# Patient Record
Sex: Female | Born: 1960 | Race: White | Hispanic: No | Marital: Married | Smoking: Never smoker
Health system: Southern US, Community
[De-identification: ages and names within clinical notes are randomized; demographics above are authoritative.]

## PROBLEM LIST (undated history)

## (undated) DIAGNOSIS — Z9889 Other specified postprocedural states: Secondary | ICD-10-CM

## (undated) DIAGNOSIS — C801 Malignant (primary) neoplasm, unspecified: Secondary | ICD-10-CM

## (undated) DIAGNOSIS — C50919 Malignant neoplasm of unspecified site of unspecified female breast: Secondary | ICD-10-CM

## (undated) DIAGNOSIS — M79606 Pain in leg, unspecified: Secondary | ICD-10-CM

## (undated) DIAGNOSIS — R112 Nausea with vomiting, unspecified: Secondary | ICD-10-CM

## (undated) DIAGNOSIS — G971 Other reaction to spinal and lumbar puncture: Secondary | ICD-10-CM

## (undated) DIAGNOSIS — Z923 Personal history of irradiation: Secondary | ICD-10-CM

## (undated) HISTORY — PX: WISDOM TOOTH EXTRACTION: SHX21

---

## 2016-11-26 DIAGNOSIS — M79606 Pain in leg, unspecified: Secondary | ICD-10-CM

## 2016-11-26 HISTORY — DX: Pain in leg, unspecified: M79.606

## 2017-05-27 DIAGNOSIS — C801 Malignant (primary) neoplasm, unspecified: Secondary | ICD-10-CM

## 2017-05-27 HISTORY — DX: Malignant (primary) neoplasm, unspecified: C80.1

## 2017-06-10 ENCOUNTER — Other Ambulatory Visit: Payer: Self-pay | Admitting: General Surgery

## 2017-06-10 DIAGNOSIS — Z170 Estrogen receptor positive status [ER+]: Principal | ICD-10-CM

## 2017-06-10 DIAGNOSIS — C50511 Malignant neoplasm of lower-outer quadrant of right female breast: Secondary | ICD-10-CM

## 2017-06-11 ENCOUNTER — Encounter (HOSPITAL_BASED_OUTPATIENT_CLINIC_OR_DEPARTMENT_OTHER): Payer: Self-pay | Admitting: *Deleted

## 2017-06-11 ENCOUNTER — Other Ambulatory Visit: Payer: Self-pay | Admitting: General Surgery

## 2017-06-11 ENCOUNTER — Other Ambulatory Visit: Payer: Self-pay

## 2017-06-11 DIAGNOSIS — C50511 Malignant neoplasm of lower-outer quadrant of right female breast: Secondary | ICD-10-CM

## 2017-06-11 DIAGNOSIS — Z170 Estrogen receptor positive status [ER+]: Principal | ICD-10-CM

## 2017-06-25 ENCOUNTER — Ambulatory Visit
Admission: RE | Admit: 2017-06-25 | Discharge: 2017-06-25 | Disposition: A | Discharge status: Home / Self Care | Payer: Managed Care, Other (non HMO) | Source: Ambulatory Visit | Attending: General Surgery | Admitting: General Surgery

## 2017-06-25 DIAGNOSIS — C50511 Malignant neoplasm of lower-outer quadrant of right female breast: Secondary | ICD-10-CM

## 2017-06-25 DIAGNOSIS — Z170 Estrogen receptor positive status [ER+]: Principal | ICD-10-CM

## 2017-06-25 NOTE — Pre-Procedure Instructions (Signed)
NPO after midnight tonight except ensure presurg to complete by 0615. Pt verbalized understanding.

## 2017-06-26 ENCOUNTER — Other Ambulatory Visit: Payer: Self-pay

## 2017-06-26 ENCOUNTER — Ambulatory Visit
Admission: RE | Admit: 2017-06-26 | Discharge: 2017-06-26 | Disposition: A | Discharge status: Home / Self Care | Payer: Managed Care, Other (non HMO) | Source: Ambulatory Visit | Attending: General Surgery | Admitting: General Surgery

## 2017-06-26 ENCOUNTER — Ambulatory Visit (HOSPITAL_BASED_OUTPATIENT_CLINIC_OR_DEPARTMENT_OTHER)
Admission: RE | Admit: 2017-06-26 | Discharge: 2017-06-26 | Disposition: A | Discharge status: Home / Self Care | Payer: Managed Care, Other (non HMO) | Source: Ambulatory Visit | Attending: General Surgery | Admitting: General Surgery

## 2017-06-26 ENCOUNTER — Ambulatory Visit (HOSPITAL_BASED_OUTPATIENT_CLINIC_OR_DEPARTMENT_OTHER): Payer: Managed Care, Other (non HMO) | Admitting: Certified Registered"

## 2017-06-26 ENCOUNTER — Encounter (HOSPITAL_BASED_OUTPATIENT_CLINIC_OR_DEPARTMENT_OTHER)
Admission: RE | Disposition: A | Discharge status: Home / Self Care | Payer: Self-pay | Source: Ambulatory Visit | Attending: General Surgery

## 2017-06-26 ENCOUNTER — Encounter (HOSPITAL_COMMUNITY): Payer: Managed Care, Other (non HMO)

## 2017-06-26 ENCOUNTER — Encounter (HOSPITAL_BASED_OUTPATIENT_CLINIC_OR_DEPARTMENT_OTHER): Payer: Self-pay | Admitting: Anesthesiology

## 2017-06-26 ENCOUNTER — Encounter (HOSPITAL_COMMUNITY)
Admission: RE | Admit: 2017-06-26 | Discharge: 2017-06-26 | Disposition: A | Discharge status: Home / Self Care | Payer: Managed Care, Other (non HMO) | Source: Ambulatory Visit | Attending: General Surgery | Admitting: General Surgery

## 2017-06-26 DIAGNOSIS — Z170 Estrogen receptor positive status [ER+]: Principal | ICD-10-CM

## 2017-06-26 DIAGNOSIS — C50511 Malignant neoplasm of lower-outer quadrant of right female breast: Secondary | ICD-10-CM

## 2017-06-26 HISTORY — PX: BREAST LUMPECTOMY: SHX2

## 2017-06-26 HISTORY — DX: Pain in leg, unspecified: M79.606

## 2017-06-26 HISTORY — DX: Other reaction to spinal and lumbar puncture: G97.1

## 2017-06-26 HISTORY — DX: Other specified postprocedural states: Z98.890

## 2017-06-26 HISTORY — DX: Other specified postprocedural states: R11.2

## 2017-06-26 HISTORY — PX: BREAST LUMPECTOMY WITH RADIOACTIVE SEED AND SENTINEL LYMPH NODE BIOPSY: SHX6550

## 2017-06-26 HISTORY — DX: Malignant (primary) neoplasm, unspecified: C80.1

## 2017-06-26 SURGERY — BREAST LUMPECTOMY WITH RADIOACTIVE SEED AND SENTINEL LYMPH NODE BIOPSY
Anesthesia: General | Site: Breast | Laterality: Right

## 2017-06-26 MED ORDER — ACETAMINOPHEN 500 MG PO TABS
ORAL_TABLET | ORAL | Status: AC
  Filled 2017-06-26: qty 2

## 2017-06-26 MED ORDER — CEFAZOLIN SODIUM-DEXTROSE 2-4 GM/100ML-% IV SOLN
2.0000 g | INTRAVENOUS | Status: AC
  Administered 2017-06-26: 2 g via INTRAVENOUS

## 2017-06-26 MED ORDER — FENTANYL CITRATE (PF) 100 MCG/2ML IJ SOLN
INTRAMUSCULAR | Status: AC
  Filled 2017-06-26: qty 2

## 2017-06-26 MED ORDER — ENSURE PRE-SURGERY PO LIQD: 296.0000 mL | Freq: Once | ORAL | Status: DC

## 2017-06-26 MED ORDER — BUPIVACAINE-EPINEPHRINE (PF) 0.5% -1:200000 IJ SOLN
INTRAMUSCULAR | Status: DC | PRN
  Administered 2017-06-26: 30 mL via PERINEURAL

## 2017-06-26 MED ORDER — LACTATED RINGERS IV SOLN
INTRAVENOUS | Status: DC
  Administered 2017-06-26 (×2): via INTRAVENOUS

## 2017-06-26 MED ORDER — SCOPOLAMINE 1 MG/3DAYS TD PT72
1.0000 | MEDICATED_PATCH | Freq: Once | TRANSDERMAL | Status: DC | PRN
  Administered 2017-06-26: 1.5 mg via TRANSDERMAL

## 2017-06-26 MED ORDER — FENTANYL CITRATE (PF) 100 MCG/2ML IJ SOLN
25.0000 ug | INTRAMUSCULAR | Status: DC | PRN
Start: 2017-06-26 — End: 2017-06-26

## 2017-06-26 MED ORDER — BUPIVACAINE-EPINEPHRINE 0.25% -1:200000 IJ SOLN
INTRAMUSCULAR | Status: DC | PRN
  Administered 2017-06-26: 10 mL

## 2017-06-26 MED ORDER — 0.9 % SODIUM CHLORIDE (POUR BTL) OPTIME
TOPICAL | Status: DC | PRN
  Administered 2017-06-26: 1000 mL

## 2017-06-26 MED ORDER — FENTANYL CITRATE (PF) 100 MCG/2ML IJ SOLN
50.0000 ug | INTRAMUSCULAR | Status: DC | PRN
  Administered 2017-06-26: 100 ug via INTRAVENOUS

## 2017-06-26 MED ORDER — PROPOFOL 10 MG/ML IV BOLUS
INTRAVENOUS | Status: DC | PRN
  Administered 2017-06-26: 150 mg via INTRAVENOUS
  Administered 2017-06-26: 50 mg via INTRAVENOUS

## 2017-06-26 MED ORDER — FENTANYL CITRATE (PF) 100 MCG/2ML IJ SOLN
INTRAMUSCULAR | Status: DC | PRN
  Administered 2017-06-26: 25 ug via INTRAVENOUS
  Administered 2017-06-26: 50 ug via INTRAVENOUS
  Administered 2017-06-26: 25 ug via INTRAVENOUS
  Administered 2017-06-26: 50 ug via INTRAVENOUS
  Administered 2017-06-26: 25 ug via INTRAVENOUS

## 2017-06-26 MED ORDER — DEXAMETHASONE SODIUM PHOSPHATE 4 MG/ML IJ SOLN: INTRAMUSCULAR | Status: DC | PRN

## 2017-06-26 MED ORDER — MIDAZOLAM HCL 2 MG/2ML IJ SOLN
INTRAMUSCULAR | Status: AC
  Filled 2017-06-26: qty 2

## 2017-06-26 MED ORDER — KETOROLAC TROMETHAMINE 15 MG/ML IJ SOLN
15.0000 mg | INTRAMUSCULAR | Status: AC
  Administered 2017-06-26: 15 mg via INTRAVENOUS

## 2017-06-26 MED ORDER — PROMETHAZINE HCL 25 MG/ML IJ SOLN: 6.2500 mg | INTRAMUSCULAR | Status: DC | PRN

## 2017-06-26 MED ORDER — DEXAMETHASONE SODIUM PHOSPHATE 4 MG/ML IJ SOLN
INTRAMUSCULAR | Status: DC | PRN
  Administered 2017-06-26: 10 mg via INTRAVENOUS

## 2017-06-26 MED ORDER — CEFAZOLIN SODIUM-DEXTROSE 2-4 GM/100ML-% IV SOLN
INTRAVENOUS | Status: AC
  Filled 2017-06-26: qty 100

## 2017-06-26 MED ORDER — ONDANSETRON HCL 4 MG/2ML IJ SOLN
INTRAMUSCULAR | Status: AC
  Filled 2017-06-26: qty 2

## 2017-06-26 MED ORDER — DEXAMETHASONE SODIUM PHOSPHATE 10 MG/ML IJ SOLN
INTRAMUSCULAR | Status: AC
  Filled 2017-06-26: qty 1

## 2017-06-26 MED ORDER — TECHNETIUM TC 99M SULFUR COLLOID FILTERED
1.0000 | Freq: Once | INTRAVENOUS | Status: AC | PRN
  Administered 2017-06-26: 1 via INTRADERMAL

## 2017-06-26 MED ORDER — ACETAMINOPHEN 500 MG PO TABS
1000.0000 mg | ORAL_TABLET | ORAL | Status: AC
  Administered 2017-06-26: 1000 mg via ORAL

## 2017-06-26 MED ORDER — MIDAZOLAM HCL 5 MG/5ML IJ SOLN
INTRAMUSCULAR | Status: DC | PRN
  Administered 2017-06-26: 2 mg via INTRAVENOUS

## 2017-06-26 MED ORDER — MIDAZOLAM HCL 2 MG/2ML IJ SOLN
1.0000 mg | INTRAMUSCULAR | Status: DC | PRN
  Administered 2017-06-26: 2 mg via INTRAVENOUS

## 2017-06-26 MED ORDER — GABAPENTIN 100 MG PO CAPS
100.0000 mg | ORAL_CAPSULE | ORAL | Status: AC
  Administered 2017-06-26: 100 mg via ORAL

## 2017-06-26 MED ORDER — GLYCOPYRROLATE 0.2 MG/ML IJ SOLN
INTRAMUSCULAR | Status: DC | PRN
  Administered 2017-06-26: 0.1 mg via INTRAVENOUS

## 2017-06-26 MED ORDER — PHENYLEPHRINE 40 MCG/ML (10ML) SYRINGE FOR IV PUSH (FOR BLOOD PRESSURE SUPPORT)
PREFILLED_SYRINGE | INTRAVENOUS | Status: AC
  Filled 2017-06-26: qty 20

## 2017-06-26 MED ORDER — ONDANSETRON HCL 4 MG/2ML IJ SOLN
INTRAMUSCULAR | Status: DC | PRN
  Administered 2017-06-26: 4 mg via INTRAVENOUS

## 2017-06-26 MED ORDER — LIDOCAINE HCL (CARDIAC) PF 100 MG/5ML IV SOSY
PREFILLED_SYRINGE | INTRAVENOUS | Status: DC | PRN
  Administered 2017-06-26: 30 mg via INTRAVENOUS

## 2017-06-26 MED ORDER — CLONIDINE HCL (ANALGESIA) 100 MCG/ML EP SOLN
EPIDURAL | Status: DC | PRN
  Administered 2017-06-26: 50 ug

## 2017-06-26 MED ORDER — OXYCODONE-ACETAMINOPHEN 10-325 MG PO TABS: 1.0000 | ORAL_TABLET | Freq: Four times a day (QID) | ORAL | 0 refills | Status: DC | PRN

## 2017-06-26 MED ORDER — KETOROLAC TROMETHAMINE 15 MG/ML IJ SOLN
INTRAMUSCULAR | Status: AC
  Filled 2017-06-26: qty 1

## 2017-06-26 MED ORDER — GABAPENTIN 300 MG PO CAPS
ORAL_CAPSULE | ORAL | Status: AC
  Filled 2017-06-26: qty 1

## 2017-06-26 MED ORDER — LIDOCAINE HCL (CARDIAC) PF 100 MG/5ML IV SOSY
PREFILLED_SYRINGE | INTRAVENOUS | Status: AC
  Filled 2017-06-26: qty 5

## 2017-06-26 MED ORDER — SCOPOLAMINE 1 MG/3DAYS TD PT72
MEDICATED_PATCH | TRANSDERMAL | Status: AC
  Filled 2017-06-26: qty 1

## 2017-06-26 MED ORDER — PROPOFOL 10 MG/ML IV BOLUS
INTRAVENOUS | Status: AC
  Filled 2017-06-26: qty 20

## 2017-06-26 MED ORDER — PHENYLEPHRINE HCL 10 MG/ML IJ SOLN
INTRAMUSCULAR | Status: DC | PRN
  Administered 2017-06-26 (×6): 80 ug via INTRAVENOUS

## 2017-06-26 MED ORDER — GABAPENTIN 100 MG PO CAPS
ORAL_CAPSULE | ORAL | Status: AC
  Filled 2017-06-26: qty 1

## 2017-06-26 SURGICAL SUPPLY — 58 items
APPLIER CLIP 9.375 MED OPEN (MISCELLANEOUS) ×3
BINDER BREAST LRG (GAUZE/BANDAGES/DRESSINGS) IMPLANT
BINDER BREAST MEDIUM (GAUZE/BANDAGES/DRESSINGS) IMPLANT
BINDER BREAST XLRG (GAUZE/BANDAGES/DRESSINGS) ×3 IMPLANT
BINDER BREAST XXLRG (GAUZE/BANDAGES/DRESSINGS) IMPLANT
BLADE SURG 15 STRL LF DISP TIS (BLADE) ×1 IMPLANT
BLADE SURG 15 STRL SS (BLADE) ×2
CANISTER SUC SOCK COL 7IN (MISCELLANEOUS) IMPLANT
CANISTER SUCT 1200ML W/VALVE (MISCELLANEOUS) IMPLANT
CHLORAPREP W/TINT 26ML (MISCELLANEOUS) ×3 IMPLANT
CLIP APPLIE 9.375 MED OPEN (MISCELLANEOUS) ×1 IMPLANT
CLIP VESOCCLUDE SM WIDE 6/CT (CLIP) IMPLANT
CLOSURE WOUND 1/2 X4 (GAUZE/BANDAGES/DRESSINGS) ×2
COVER BACK TABLE 60X90IN (DRAPES) ×3 IMPLANT
COVER MAYO STAND STRL (DRAPES) ×3 IMPLANT
COVER PROBE W GEL 5X96 (DRAPES) ×3 IMPLANT
DECANTER SPIKE VIAL GLASS SM (MISCELLANEOUS) IMPLANT
DERMABOND ADVANCED (GAUZE/BANDAGES/DRESSINGS) ×4
DERMABOND ADVANCED .7 DNX12 (GAUZE/BANDAGES/DRESSINGS) ×2 IMPLANT
DEVICE DUBIN W/COMP PLATE 8390 (MISCELLANEOUS) ×3 IMPLANT
DRAPE LAPAROSCOPIC ABDOMINAL (DRAPES) ×3 IMPLANT
DRAPE UTILITY XL STRL (DRAPES) ×3 IMPLANT
ELECT COATED BLADE 2.86 ST (ELECTRODE) ×3 IMPLANT
ELECT REM PT RETURN 9FT ADLT (ELECTROSURGICAL) ×3
ELECTRODE REM PT RTRN 9FT ADLT (ELECTROSURGICAL) ×1 IMPLANT
GLOVE BIO SURGEON STRL SZ7 (GLOVE) ×6 IMPLANT
GLOVE BIOGEL PI IND STRL 7.5 (GLOVE) ×1 IMPLANT
GLOVE BIOGEL PI INDICATOR 7.5 (GLOVE) ×2
GOWN STRL REUS W/ TWL LRG LVL3 (GOWN DISPOSABLE) ×2 IMPLANT
GOWN STRL REUS W/TWL LRG LVL3 (GOWN DISPOSABLE) ×4
HEMOSTAT ARISTA ABSORB 3G PWDR (MISCELLANEOUS) IMPLANT
ILLUMINATOR WAVEGUIDE N/F (MISCELLANEOUS) IMPLANT
KIT MARKER MARGIN INK (KITS) ×3 IMPLANT
LIGHT WAVEGUIDE WIDE FLAT (MISCELLANEOUS) IMPLANT
NDL SAFETY ECLIPSE 18X1.5 (NEEDLE) IMPLANT
NEEDLE HYPO 18GX1.5 SHARP (NEEDLE)
NEEDLE HYPO 25X1 1.5 SAFETY (NEEDLE) ×3 IMPLANT
NS IRRIG 1000ML POUR BTL (IV SOLUTION) ×3 IMPLANT
PACK BASIN DAY SURGERY FS (CUSTOM PROCEDURE TRAY) ×3 IMPLANT
PENCIL BUTTON HOLSTER BLD 10FT (ELECTRODE) ×3 IMPLANT
SLEEVE SCD COMPRESS KNEE MED (MISCELLANEOUS) ×3 IMPLANT
SPONGE LAP 4X18 X RAY DECT (DISPOSABLE) ×3 IMPLANT
STRIP CLOSURE SKIN 1/2X4 (GAUZE/BANDAGES/DRESSINGS) ×4 IMPLANT
SUT ETHILON 2 0 FS 18 (SUTURE) IMPLANT
SUT MNCRL AB 4-0 PS2 18 (SUTURE) ×3 IMPLANT
SUT MON AB 5-0 PS2 18 (SUTURE) ×3 IMPLANT
SUT SILK 2 0 SH (SUTURE) ×3 IMPLANT
SUT VIC AB 2-0 SH 27 (SUTURE) ×4
SUT VIC AB 2-0 SH 27XBRD (SUTURE) ×2 IMPLANT
SUT VIC AB 3-0 SH 27 (SUTURE) ×4
SUT VIC AB 3-0 SH 27X BRD (SUTURE) ×2 IMPLANT
SUT VIC AB 5-0 PS2 18 (SUTURE) IMPLANT
SYR CONTROL 10ML LL (SYRINGE) ×3 IMPLANT
TOWEL OR 17X24 6PK STRL BLUE (TOWEL DISPOSABLE) ×3 IMPLANT
TOWEL OR NON WOVEN STRL DISP B (DISPOSABLE) ×3 IMPLANT
TUBE CONNECTING 20'X1/4 (TUBING)
TUBE CONNECTING 20X1/4 (TUBING) IMPLANT
YANKAUER SUCT BULB TIP NO VENT (SUCTIONS) IMPLANT

## 2017-06-26 NOTE — Discharge Instructions (Signed)
[facility] °Office Phone Number [phone] ° °POST OP INSTRUCTIONS ° °Always review your discharge instruction sheet given to you by the facility where your surgery was performed. ° °IF YOU HAVE DISABILITY OR FAMILY LEAVE FORMS, YOU MUST BRING THEM TO THE OFFICE FOR PROCESSING.  DO NOT GIVE THEM TO YOUR DOCTOR. ° °1. A prescription for pain medication may be given to you upon discharge.  Take your pain medication as prescribed, if needed.  If narcotic pain medicine is not needed, then you may take acetaminophen (Tylenol), naprosyn (Alleve) or ibuprofen (Advil) as needed. °2. Take your usually prescribed medications unless otherwise directed °3. If you need a refill on your pain medication, please contact your pharmacy.  They will contact our office to request authorization.  Prescriptions will not be filled after 5pm or on week-ends. °4. You should eat very light the first 24 hours after surgery, such as soup, crackers, pudding, etc.  Resume your normal diet the day after surgery. °5. Most patients will experience some swelling and bruising in the breast.  Ice packs and a good support bra will help.  Wear the breast binder provided or a sports bra for 72 hours day and night.  After that wear a sports bra during the day until you return to the office. Swelling and bruising can take several days to resolve.  °6. It is common to experience some constipation if taking pain medication after surgery.  Increasing fluid intake and taking a stool softener will usually help or prevent this problem from occurring.  A mild laxative (Milk of Magnesia or Miralax) should be taken according to package directions if there are no bowel movements after 48 hours. °7. Unless discharge instructions indicate otherwise, you may remove your bandages 48 hours after surgery and you may shower at that time.  You may have steri-strips (small skin tapes) in place directly over the incision.  These strips should be left on the  skin for 7-10 days and will come off on their own.  If your surgeon used skin glue on the incision, you may shower in 24 hours.  The glue will flake off over the next 2-3 weeks.  Any sutures or staples will be removed at the office during your follow-up visit. °8. ACTIVITIES:  You may resume regular daily activities (gradually increasing) beginning the next day.  Wearing a good support bra or sports bra minimizes pain and swelling.  You may have sexual intercourse when it is comfortable. °a. You may drive when you no longer are taking prescription pain medication, you can comfortably wear a seatbelt, and you can safely maneuver your car and apply brakes. °b. RETURN TO WORK:  ______________________________________________________________________________________ °9. You should see your doctor in the office for a follow-up appointment approximately two weeks after your surgery.  Your doctor’s nurse will typically make your follow-up appointment when she calls you with your pathology report.  Expect your pathology report 3-4 business days after your surgery.  You may call to check if you do not hear from us after three days. °10. OTHER INSTRUCTIONS: _______________________________________________________________________________________________ _____________________________________________________________________________________________________________________________________ °_____________________________________________________________________________________________________________________________________ °_____________________________________________________________________________________________________________________________________ ° °WHEN TO CALL DR WAKEFIELD: °1. Fever over 101.0 °2. Nausea and/or vomiting. °3. Extreme swelling or bruising. °4. Continued bleeding from incision. °5. Increased pain, redness, or drainage from the incision. ° °The clinic staff is available to answer your questions during regular  business hours.  Please don’t hesitate to call and ask to speak to one of the nurses for clinical concerns.  If   you have a medical emergency, go to the nearest emergency room or call 911.  A surgeon from [facility] is always on call at the hospital.  For further questions, please visit [email or web address]    Regional Anesthesia Blocks  1. Numbness or the inability to move the "blocked" extremity may last from 3-48 hours after placement. The length of time depends on the medication injected and your individual response to the medication. If the numbness is not going away after 48 hours, call your surgeon.  2. The extremity that is blocked will need to be protected until the numbness is gone and the  Strength has returned. Because you cannot feel it, you will need to take extra care to avoid injury. Because it may be weak, you may have difficulty moving it or using it. You may not know what position it is in without looking at it while the block is in effect.  3. For blocks in the legs and feet, returning to weight bearing and walking needs to be done carefully. You will need to wait until the numbness is entirely gone and the strength has returned. You should be able to move your leg and foot normally before you try and bear weight or walk. You will need someone to be with you when you first try to ensure you do not fall and possibly risk injury.  4. Bruising and tenderness at the needle site are common side effects and will resolve in a few days.  5. Persistent numbness or new problems with movement should be communicated to the surgeon or the [facility] [phone] [place] [phone]).

## 2017-06-26 NOTE — Transfer of Care (Signed)
Immediate Anesthesia Transfer of Care Note  Patient: Carla Bryan  Procedure(s) Performed: BREAST LUMPECTOMY WITH RADIOACTIVE SEED AND SENTINEL LYMPH NODE BIOPSY (Right Breast)  Patient Location: PACU  Anesthesia Type:GA combined with regional for post-op pain  Level of Consciousness: awake and patient cooperative  Airway & Oxygen Therapy: Patient Spontanous Breathing and Patient connected to face mask oxygen  Post-op Assessment: Report given to RN and Post -op Vital signs reviewed and stable  Post vital signs: Reviewed and stable  Last Vitals:  Vitals Value Taken Time  BP    Temp    Pulse 90 06/26/2017 11:06 AM  Resp    SpO2 98 % 06/26/2017 11:06 AM  Vitals shown include unvalidated device data.  Last Pain:  Vitals:   06/26/17 0856  TempSrc: Oral         Complications: No apparent anesthesia complications

## 2017-06-26 NOTE — Interval H&P Note (Signed)
History and Physical Interval Note:  06/26/2017 9:46 AM  Carla Bryan  has presented today for surgery, with the diagnosis of RIGHT BREAST CANCER  The various methods of treatment have been discussed with the patient and family. After consideration of risks, benefits and other options for treatment, the patient has consented to  Procedure(s): BREAST LUMPECTOMY WITH RADIOACTIVE SEED AND SENTINEL LYMPH NODE BIOPSY (Right) as a surgical intervention .  The patient's history has been reviewed, patient examined, no change in status, stable for surgery.  I have reviewed the patient's chart and labs.  Questions were answered to the patient's satisfaction.     Rolm Bookbinder

## 2017-06-26 NOTE — H&P (Signed)
57 yof referred by Aldean Ast for new right breast cancer. no personal history of breast disease. no fh breast or ovarian cancer. she had a ct scan due to swelling in her right leg and apparently had right breast mass noted. swelling still there has US showing no dvt per her report. she then had mm and [place]. has b density breasts. had 11 mm slightly lower outer quadrant right breast massss. on [place] this is 10x8x8 mm in size. axilla is negative by [place]. 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 she has no mass or dc to report. she is here to discuss options with her husband and her daughter. she lives in [place].   Past Surgical History Sabino Gasser; 06/10/2017 3:36 PM) Cesarean Section - Multiple   Diagnostic Studies History Sabino Gasser; 06/10/2017 3:36 PM) Colonoscopy  never Pap Smear  1-5 years ago  Allergies Sabino Gasser; 06/10/2017 3:36 PM) Azithromycin *CHEMICALS*  Allergies Reconciled   Medication History Sabino Gasser; 06/10/2017 3:36 PM) Gabapentin (100MG  Capsule, Oral) Active. Medications Reconciled  Social History Sabino Gasser; 06/10/2017 3:36 PM) Caffeine use  Tea. No alcohol use  No drug use  Tobacco use  Never smoker.  Family History Sabino Gasser; 06/10/2017 3:36 PM) First Degree Relatives  No pertinent family history   Pregnancy / Birth History Sabino Gasser; 06/10/2017 3:36 PM) Age at menarche  57 years. Age of menopause  47-50 Gravida  2 Length (months) of breastfeeding  12-24 Maternal age  56-30 Para  2  Other Problems Sabino Gasser; 06/10/2017 3:36 PM) No pertinent past medical history     Review of Systems Sabino Gasser; 06/10/2017 3:36 PM) General Not Present- Appetite Loss, Chills, Fatigue, Fever, Night Sweats, Weight Gain and Weight Loss. Skin Not Present- Change in Wart/Mole, Dryness, Hives, Jaundice, New Lesions, Non-Healing Wounds, Rash and  Ulcer. HEENT Not Present- Earache, Hearing Loss, Hoarseness, Nose Bleed, Oral Ulcers, Ringing in the Ears, Seasonal Allergies, Sinus Pain, Sore Throat, Visual Disturbances, Wears glasses/contact lenses and Yellow Eyes. Respiratory Not Present- Bloody sputum, Chronic Cough, Difficulty Breathing, Snoring and Wheezing. Breast Not Present- Breast Mass, Breast Pain, Nipple Discharge and Skin Changes. Cardiovascular Not Present- Chest Pain, Difficulty Breathing Lying Down, Leg Cramps, Palpitations, Rapid Heart Rate, Shortness of Breath and Swelling of Extremities. Gastrointestinal Not Present- Abdominal Pain, Bloating, Bloody Stool, Change in Bowel Habits, Chronic diarrhea, Constipation, Difficulty Swallowing, Excessive gas, Gets full quickly at meals, Hemorrhoids, Indigestion, Nausea, Rectal Pain and Vomiting. Female Genitourinary Not Present- Frequency, Nocturia, Painful Urination, Pelvic Pain and Urgency. Musculoskeletal Present- Joint Pain and Swelling of Extremities. Not Present- Back Pain, Joint Stiffness, Muscle Pain and Muscle Weakness. Neurological Not Present- Decreased Memory, Fainting, Headaches, Numbness, Seizures, Tingling, Tremor, Trouble walking and Weakness. Psychiatric Not Present- Anxiety, Bipolar, Change in Sleep Pattern, Depression, Fearful and Frequent crying. Endocrine Not Present- Cold Intolerance, Excessive Hunger, Hair Changes, Heat Intolerance, Hot flashes and New Diabetes. Hematology Not Present- Blood Thinners, Easy Bruising, Excessive bleeding, Gland problems, HIV and Persistent Infections.  Vitals Sabino Gasser; 06/10/2017 3:37 PM) 06/10/2017 3:36 PM Weight: 181.5 lb Height: 59in Body Surface Area: 1.77 m Body Mass Index: 36.66 kg/m  Temp.: 98.31F(Oral)  Pulse: 107 (Regular)  BP: 130/82 (Sitting, Left Arm, Standard) Physical Exam Rolm Bookbinder MD; 06/10/2017 10:53 PM) General Mental Status-Alert. Head and Neck Trachea-midline. Thyroid Gland  Characteristics - normal size and consistency. Eye Sclera/Conjunctiva - Bilateral-No scleral icterus. Chest and Lung Exam  Chest and lung exam reveals -quiet, even and easy respiratory effort with no use of accessory muscles and on auscultation, normal breath sounds, no adventitious sounds and normal vocal resonance. Breast Nipples-No Discharge. Breast Lump-No Palpable Breast Mass. Cardiovascular Cardiovascular examination reveals -normal heart sounds, regular rate and rhythm with no murmurs. Abdomen Note: soft no hepatomegaly Neurologic Neurologic evaluation reveals -alert and oriented x 3 with no impairment of recent or remote memory. Lymphatic Head & Neck General Head & Neck Lymphatics: Bilateral - Description - Normal. Axillary General Axillary Region: Bilateral - Description - Normal. Note: no [place] adenopathy   Assessment & Plan Rolm Bookbinder MD; 06/10/2017 11:00 PM) BREAST CANCER OF LOWER-OUTER QUADRANT OF RIGHT FEMALE BREAST (C50.511) Story: Right breast seed guided lumpectomy, right axillary sentinel node biopsy I discussed staging and pathophysiology of breast cancer with all available treatment options. We discussed that I dont think she needs either genetics or mri testing. I recommend sentinel node biopsy. we discussed performance with radioactive tracer and incision below axillary hairline. will await permanent pathology prior to any decisions. small chance to return to or for alnd. understands the 5-8% risk of lymphedema and shoulder issues as well. We discussed lumpectomy with radiotherapy vs mastectomy. I think she is good lumpectomy candidate. we discussed mastectomy is not 100% preventive and really provides no local control or survival advantage for her. lumpectomy woudl be combined with radiotherapy and there is 5% risk of positive margin requiring additional surgery. discussed risks of surgery and recovery with her. discussed oncotype to determine  chemotherapy after surgery. will refer to med onc in [place] and investigate rad onc closer to her home. will schedule surgery in next couple weeks.

## 2017-06-26 NOTE — Anesthesia Procedure Notes (Signed)
Anesthesia Regional Block: Pectoralis block   Pre-Anesthetic Checklist: ,, timeout performed, Correct Patient, Correct Site, Correct Laterality, Correct Procedure, Correct Position, site marked, Risks and benefits discussed,  Surgical consent,  Pre-op evaluation,  At surgeon's request and post-op pain management  Laterality: Right  Prep: chloraprep       Needles:  Injection technique: Single-shot  Needle Type: Echogenic Needle     Needle Length: 9cm  Needle Gauge: 21     Additional Needles:   Procedures:,,,, ultrasound used (permanent image in chart),,,,  Narrative:  Start time: 06/26/2017 9:32 AM End time: 06/26/2017 9:37 AM Injection made incrementally with aspirations every 5 mL.  Performed by: Personally  Anesthesiologist: Catalina Gravel, MD  Additional Notes: No pain on injection. No increased resistance to injection. Injection made in 5cc increments.  Good needle visualization.  Patient tolerated procedure well.

## 2017-06-26 NOTE — Progress Notes (Signed)
Assisted Dr. Gifford Shave with right, ultrasound guided, pectoralis block. Side rails up, monitors on throughout procedure. See vital signs in flow sheet. Tolerated Procedure well.

## 2017-06-26 NOTE — Op Note (Signed)
Preoperative diagnosis: clinical stage I right breast cancer Postoperative diagnosis: same as above Procedure: Right breast seed guided lumpectomy Rightdeep axillary sentinel node biopsy Surgeon: Dr Serita Grammes DDU:[id number] Anes: general  Specimens  1.rightbreast tissue marked with paint 2.rightaxillary sentinel nodes with highest [id number] Complications none Drains none Sponge count correct Dispo to pacu stable  Indications: This is a33 yof with clinical stage I right breast cancer. We have discussed options and have elected to proceed with lumpectomy/sn biopsy.  A seed was placed in the breast mass prior to beginning.    Procedure: After informed consent was obtained the patient was taken to the operating room. She first was given technetium in standard periareolar fashion. She had a pectoral block. She was given antibiotics. Sequential compression devices were on her legs. She was then placed under general anesthesia with an LMA. Then she was prepped and draped in the standard sterile surgical fashion. Surgical timeout was then performed.  I then located the seed in thelower outer right breastI infiltrated marcaine and then made a periareolar incision to hide the scar. I then used the neoprobe to remove the seed and the surrounding tissue with attempt to get clear margins. I marked this with paint. MM confirmed removal of seed and theclip.I did remove additional posterior and inferior margins as the seed was close to that. the posterior margin is muscle. I placed clips in the cavity.I then obtained hemostasis. This was marked as above.I closed with with 2-0 vicryl to approximate breast tissue. The skin was closed with 3-0 vicryl and 5-0 monocryl. Glue and steristrips were placed.   I then made a low axillary incision after locating the sentinel node. Icarried this through the axillary fascia.there was radioactivity present that was easily noted I removed  the radioactive nodes.The background radioactivity was minimal.I then obtained hemostasis. I closed the axillary fascia with 2-0 vicryl.The skin was closed with 3-0 vicryl and 4-0 monocryl. Glue and steristrips were applied

## 2017-06-26 NOTE — Anesthesia Preprocedure Evaluation (Signed)
Anesthesia Evaluation  Patient identified by MRN, date of birth, ID band Patient awake    Reviewed: Allergy & Precautions, NPO status , Patient's Chart, lab work & pertinent test results  History of Anesthesia Complications (+) PONV, POST - OP SPINAL HEADACHE and history of anesthetic complications  Airway Mallampati: II  TM Distance: >3 FB Neck ROM: Full    Dental  (+) Teeth Intact, Dental Advisory Given   Pulmonary neg pulmonary ROS,    Pulmonary exam normal breath sounds clear to auscultation       Cardiovascular Exercise Tolerance: Good negative cardio ROS Normal cardiovascular exam Rhythm:Regular Rate:Normal     Neuro/Psych  Headaches,    GI/Hepatic negative GI ROS, Neg liver ROS,   Endo/Other  negative endocrine ROSObesity   Renal/GU negative Renal ROS     Musculoskeletal negative musculoskeletal ROS (+)   Abdominal   Peds  Hematology negative hematology ROS (+)   Anesthesia Other Findings Day of surgery medications reviewed with the patient.  Right breast cancer  Reproductive/Obstetrics                             Anesthesia Physical Anesthesia Plan  ASA: II  Anesthesia Plan: General   Post-op Pain Management:  Regional for Post-op pain   Induction: Intravenous  PONV Risk Score and Plan: 4 or greater and Scopolamine patch - Pre-op, Midazolam, Dexamethasone and Ondansetron  Airway Management Planned: LMA  Additional Equipment:   Intra-op Plan:   Post-operative Plan: Extubation in OR  Informed Consent: I have reviewed the patients History and Physical, chart, labs and discussed the procedure including the risks, benefits and alternatives for the proposed anesthesia with the patient or authorized representative who has indicated his/her understanding and acceptance.   Dental advisory given  Plan Discussed with: CRNA  Anesthesia Plan Comments:          Anesthesia Quick Evaluation

## 2017-06-26 NOTE — Anesthesia Procedure Notes (Signed)
Procedure Name: LMA Insertion Date/Time: 06/26/2017 10:08 AM Performed by: Marrianne Mood, CRNA Pre-anesthesia Checklist: Patient identified, Emergency Drugs available, Suction available, Patient being monitored and Timeout performed Patient Re-evaluated:Patient Re-evaluated prior to induction Oxygen Delivery Method: Circle system utilized Preoxygenation: Pre-oxygenation with 100% oxygen Induction Type: IV induction Ventilation: Mask ventilation without difficulty LMA: LMA inserted LMA Size: 4.0 Number of attempts: 1 Airway Equipment and Method: Bite block Placement Confirmation: positive ETCO2 Tube secured with: Tape Dental Injury: Teeth and Oropharynx as per pre-operative assessment

## 2017-06-27 ENCOUNTER — Encounter (HOSPITAL_BASED_OUTPATIENT_CLINIC_OR_DEPARTMENT_OTHER): Payer: Self-pay | Admitting: General Surgery

## 2017-06-27 NOTE — Anesthesia Postprocedure Evaluation (Signed)
Anesthesia Post Note  Patient: Carla Bryan  Procedure(s) Performed: BREAST LUMPECTOMY WITH RADIOACTIVE SEED AND SENTINEL LYMPH NODE BIOPSY (Right Breast)     Patient location during evaluation: PACU Anesthesia Type: General Level of consciousness: awake and alert Pain management: pain level controlled Vital Signs Assessment: post-procedure vital signs reviewed and stable Respiratory status: spontaneous breathing, nonlabored ventilation, respiratory function stable and patient connected to nasal cannula oxygen Cardiovascular status: blood pressure returned to baseline and stable Postop Assessment: no apparent nausea or vomiting Anesthetic complications: no    Last Vitals:  Vitals:   06/26/17 1145 06/26/17 1148  BP: 116/74 123/88  Pulse: 66   Resp: 16 16  Temp:  36.5 C  SpO2: 97% 96%    Last Pain:  Vitals:   06/27/17 0853  TempSrc:   PainSc: 0-No pain                 Catalina Gravel

## 2017-07-04 ENCOUNTER — Telehealth: Payer: Self-pay | Admitting: *Deleted

## 2017-07-04 ENCOUNTER — Telehealth: Payer: Self-pay | Admitting: Hematology and Oncology

## 2017-07-04 ENCOUNTER — Encounter: Payer: Self-pay | Admitting: *Deleted

## 2017-07-04 ENCOUNTER — Encounter: Payer: Self-pay | Admitting: Hematology and Oncology

## 2017-07-04 DIAGNOSIS — C50511 Malignant neoplasm of lower-outer quadrant of right female breast: Secondary | ICD-10-CM | POA: Insufficient documentation

## 2017-07-04 DIAGNOSIS — Z170 Estrogen receptor positive status [ER+]: Secondary | ICD-10-CM

## 2017-07-04 NOTE — Telephone Encounter (Signed)
Referral from Dr. Donne Hazel at [facility] for breast cancer. Tc made to the pt to schedule her to see Dr. Lindi Adie on 5/14 at 345pm. Pt aware to arrive 30 minutes early. Letter mailed.

## 2017-07-04 NOTE — Telephone Encounter (Signed)
Received order for oncotype testing per Dr. Donne Hazel. Requisition faxed to pathology. Received by Varney Biles.

## 2017-07-09 ENCOUNTER — Inpatient Hospital Stay: Payer: Managed Care, Other (non HMO) | Attending: Hematology and Oncology | Admitting: Hematology and Oncology

## 2017-07-09 DIAGNOSIS — Z170 Estrogen receptor positive status [ER+]: Secondary | ICD-10-CM

## 2017-07-09 DIAGNOSIS — M79604 Pain in right leg: Secondary | ICD-10-CM | POA: Insufficient documentation

## 2017-07-09 DIAGNOSIS — C50511 Malignant neoplasm of lower-outer quadrant of right female breast: Secondary | ICD-10-CM | POA: Diagnosis not present

## 2017-07-09 DIAGNOSIS — R600 Localized edema: Secondary | ICD-10-CM | POA: Insufficient documentation

## 2017-07-09 DIAGNOSIS — Z79899 Other long term (current) drug therapy: Secondary | ICD-10-CM | POA: Insufficient documentation

## 2017-07-09 NOTE — Progress Notes (Signed)
[facility] NOTE  Patient Care Team: Aldean Ast, Hershal Coria as PCP - General (Physician Assistant)  CHIEF COMPLAINTS/PURPOSE OF CONSULTATION:  Newly diagnosed breast cancer  HISTORY OF PRESENTING ILLNESS:  Carla Bryan 57 y.o. female is here because of recent diagnosis of right breast cancer.  Patient had initial CT scan that detected a right breast mass.  This was evaluated by mammogram and ultrasound.  Was noted to have 11 mm mass in the right breast in the lower outer quadrant.  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  She was seen by Dr. Donne Hazel who performed a right lumpectomy on 06/26/2017.  Final pathology confirmed the invasive ductal carcinoma was grade 1 and it measures 1.3 cm.  It was very close to the anterior margin and perineural invasion was present.  Lymph nodes were negative.  She is healing and recovering very well from the recent surgery.  Is here to discuss adjuvant treatment options.  I reviewed her records extensively and collaborated the history with the patient.  SUMMARY OF ONCOLOGIC HISTORY:   Malignant neoplasm of lower-outer quadrant of right breast of female, estrogen receptor positive ([place])   05/23/2017 Initial Diagnosis    Patient had a CT scan for swelling of the right leg and incidentally right breast mass was noted subsequently had a mammogram and ultrasound.  Mammogram revealed 11 mm right breast mass lower outer quadrant axilla is negative, biopsy revealed IDC grade 1 ER PR positive HER-2 negative with a Ki-67 of 1%, T1CN0 stage Ia      06/26/2017 Surgery    Right lumpectomy: IDC grade 1, 1.3 cm, less than 0.1 cm to anterior margin, perineural invasion present, 0/2 lymph nodes negative, T1c N0 stage I a, ER 90%, PR 90%, Ki-67 1%, HER-2 negative ratio 1.68     Right lumpectomy MEDICAL HISTORY:  Past Medical History:  Diagnosis Date  . Cancer ([place]) 05/2017    right breast cancer  . Leg pain 11/2016   right leg pain and swelling  . PONV (postoperative nausea and vomiting)   . Spinal headache     SURGICAL HISTORY: Past Surgical History:  Procedure Laterality Date  . BREAST LUMPECTOMY WITH RADIOACTIVE SEED AND SENTINEL LYMPH NODE BIOPSY Right 06/26/2017   Procedure: BREAST LUMPECTOMY WITH RADIOACTIVE SEED AND SENTINEL LYMPH NODE BIOPSY;  Surgeon: Rolm Bookbinder, MD;  Location: [facility];  Service: General;  Laterality: Right;  . [place]  . WISDOM TOOTH EXTRACTION     as teenager    SOCIAL HISTORY: Social History   Socioeconomic History  . Marital status: Married    Spouse name: Not on file  . Number of children: Not on file  . Years of education: Not on file  . Highest education level: Not on file  Occupational History  . Not on file  Social Needs  . Financial resource strain: Not on file  . Food insecurity:    Worry: Not on file    Inability: Not on file  . Transportation needs:    Medical: Not on file    Non-medical: Not on file  Tobacco Use  . Smoking status: Never Smoker  . Smokeless tobacco: Never Used  Substance and Sexual Activity  . Alcohol use: Not Currently  . Drug use: Never  . Sexual activity: Not on file  Lifestyle  . Physical activity:    Days per week:  Not on file    Minutes per session: Not on file  . Stress: Not on file  Relationships  . Social connections:    Talks on phone: Not on file    Gets together: Not on file    Attends religious service: Not on file    Active member of club or organization: Not on file    Attends meetings of clubs or organizations: Not on file    Relationship status: Not on file  . Intimate partner violence:    Fear of current or ex partner: Not on file    Emotionally abused: Not on file    Physically abused: Not on file    Forced sexual activity: Not on file  Other Topics Concern  . Not on file  Social History Narrative  .  Not on file    FAMILY HISTORY: No family history on file.  ALLERGIES:  is allergic to erythromycin.  MEDICATIONS:  Current Outpatient Medications  Medication Sig Dispense Refill  . gabapentin (NEURONTIN) 100 MG capsule Take 100 mg by mouth at bedtime.     Marland Kitchen oxyCODONE-acetaminophen (PERCOCET) 10-325 MG tablet Take 1 tablet by mouth every 6 (six) hours as needed for pain. 10 tablet 0   No current facility-administered medications for this visit.     REVIEW OF SYSTEMS:   Constitutional: Denies fevers, chills or abnormal night sweats Eyes: Denies blurriness of vision, double vision or watery eyes Ears, nose, mouth, throat, and face: Denies mucositis or sore throat Respiratory: Denies cough, dyspnea or wheezes Cardiovascular: Denies palpitation, chest discomfort or lower extremity swelling Gastrointestinal:  Denies nausea, heartburn or change in bowel habits Skin: Denies abnormal skin rashes Lymphatics: Denies new lymphadenopathy or easy bruising Neurological:Denies numbness, tingling or new weaknesses Behavioral/Psych: Mood is stable, no new changes  Breast: Right lumpectomy  All other systems were reviewed with the patient and are negative.  PHYSICAL EXAMINATION: ECOG PERFORMANCE STATUS: 1 - Symptomatic but completely ambulatory  Vitals:   07/09/17 1550  BP: 110/78  Pulse: 89  Resp: 20  Temp: 98.3 F (36.8 C)   Filed Weights   07/09/17 1550  Weight: 181 lb (82.1 kg)    GENERAL:alert, no distress and comfortable SKIN: skin color, texture, turgor are normal, no rashes or significant lesions EYES: normal, conjunctiva are pink and non-injected, sclera clear OROPHARYNX:no exudate, no erythema and lips, buccal mucosa, and tongue normal  NECK: supple, thyroid normal size, non-tender, without nodularity LYMPH:  no palpable lymphadenopathy in the cervical, axillary or inguinal LUNGS: clear to auscultation and percussion with normal breathing effort HEART: regular rate &  rhythm and no murmurs and no lower extremity edema ABDOMEN:abdomen soft, non-tender and normal bowel sounds Musculoskeletal:no cyanosis of digits and no clubbing  PSYCH: alert & oriented x 3 with fluent speech NEURO: no focal motor/sensory deficits  LABORATORY DATA:  I have reviewed the data as listed No results found for: WBC, HGB, HCT, MCV, PLT No results found for: NA, K, CL, CO2  RADIOGRAPHIC STUDIES: I have personally reviewed the radiological reports and agreed with the findings in the report.  ASSESSMENT AND PLAN:  Malignant neoplasm of lower-outer quadrant of right breast of female, estrogen receptor positive ([place]) 06/26/2017 right lumpectomy: IDC grade 1, 1.3 cm, less than 0.1 cm to anterior margin, perineural invasion present, 0/2 lymph nodes negative, T1c N0 stage I a, ER 90%, PR 90%, Ki-67 1%, HER-2 negative ratio 1.68  Pathology counseling: I discussed the final pathology report of the patient provided  a copy of this report. I discussed the margins as well as lymph node surgeries. We also discussed the final staging along with previously performed ER/PR and HER-2/neu testing.  Recommendation: 1.  Oncotype testing to determine if she would benefit from systemic chemotherapy 2. adjuvant radiation therapy 3.  Followed by adjuvant antiestrogen therapy with letrozole 2.5 mg daily x5 to 7 years  Oncotype counseling: I discussed Oncotype DX test. I explained to the patient that this is a 21 gene panel to evaluate patient tumors DNA to calculate recurrence score. This would help determine whether patient has high risk or intermediate risk or low risk breast cancer. She understands that if her tumor was found to be high risk, she would benefit from systemic chemotherapy. If low risk, no need of chemotherapy. If she was found to be intermediate risk, we would need to evaluate the score as well as other risk factors and determine if an abbreviated chemotherapy may be of benefit.  Pain in  the right leg: Previous ultrasounds did not show any evidence of blood clot.  CT abdomen pelvis also did not show any vascular complications.  The leg swelling appears to have improved.  The pain appears to be neuropathic in nature.  Gabapentin was started at 100 mg at bedtime and she appears to be slowly improving.  However this is been going on since last year fall and has been wearing her down.  She prefers not to take gabapentin but it seems to be helping her so she is continuing with it.  She feels groggy when she takes gabapentin.  Return to clinic based upon Oncotype test results     Harriette Ohara, MD 07/09/17

## 2017-07-09 NOTE — Assessment & Plan Note (Signed)
06/26/2017 right lumpectomy: IDC grade 1, 1.3 cm, less than 0.1 cm to anterior margin, perineural invasion present, 0/2 lymph nodes negative, T1c N0 stage I a, ER 90%, PR 90%, Ki-67 1%, HER-2 negative ratio 1.68  Pathology counseling: I discussed the final pathology report of the patient provided  a copy of this report. I discussed the margins as well as lymph node surgeries. We also discussed the final staging along with previously performed ER/PR and HER-2/neu testing.  Recommendation: 1.  Oncotype testing to determine if she would benefit from systemic chemotherapy 2. adjuvant radiation therapy 3.  Followed by adjuvant antiestrogen therapy with letrozole 2.5 mg daily x5 to 7 years  Oncotype counseling: I discussed Oncotype DX test. I explained to the patient that this is a 21 gene panel to evaluate patient tumors DNA to calculate recurrence score. This would help determine whether patient has high risk or intermediate risk or low risk breast cancer. She understands that if her tumor was found to be high risk, she would benefit from systemic chemotherapy. If low risk, no need of chemotherapy. If she was found to be intermediate risk, we would need to evaluate the score as well as other risk factors and determine if an abbreviated chemotherapy may be of benefit.  Return to clinic based upon Oncotype test results

## 2017-07-10 ENCOUNTER — Encounter: Payer: Self-pay | Admitting: *Deleted

## 2017-07-15 ENCOUNTER — Telehealth: Payer: Self-pay | Admitting: *Deleted

## 2017-07-15 ENCOUNTER — Encounter (HOSPITAL_COMMUNITY): Payer: Self-pay | Admitting: General Surgery

## 2017-07-15 ENCOUNTER — Encounter: Payer: Self-pay | Admitting: Hematology and Oncology

## 2017-07-15 NOTE — Telephone Encounter (Signed)
Received oncotype score of 17/5%. Physician team notified. Left vm for pt to return call to discuss the results.

## 2017-07-15 NOTE — Telephone Encounter (Signed)
Gave pt oncotype score of 17. Informed chemo is not recommended.

## 2017-07-15 NOTE — Progress Notes (Signed)
Referral sent to [facility] per Dr Lindi Adie, faxed to [phone], confirmation received

## 2017-08-05 ENCOUNTER — Telehealth: Payer: Self-pay | Admitting: Hematology and Oncology

## 2017-08-05 NOTE — Telephone Encounter (Signed)
Mailed patient avs and calendar of upcoming July appointments per 6/6 sch message

## 2017-08-27 ENCOUNTER — Encounter: Payer: Self-pay | Admitting: *Deleted

## 2017-09-02 ENCOUNTER — Telehealth: Payer: Self-pay | Admitting: Hematology and Oncology

## 2017-09-02 ENCOUNTER — Inpatient Hospital Stay: Payer: Managed Care, Other (non HMO) | Attending: Hematology and Oncology | Admitting: Hematology and Oncology

## 2017-09-02 DIAGNOSIS — F419 Anxiety disorder, unspecified: Secondary | ICD-10-CM | POA: Insufficient documentation

## 2017-09-02 DIAGNOSIS — F439 Reaction to severe stress, unspecified: Secondary | ICD-10-CM | POA: Insufficient documentation

## 2017-09-02 DIAGNOSIS — Z923 Personal history of irradiation: Secondary | ICD-10-CM | POA: Insufficient documentation

## 2017-09-02 DIAGNOSIS — Z170 Estrogen receptor positive status [ER+]: Secondary | ICD-10-CM | POA: Diagnosis not present

## 2017-09-02 DIAGNOSIS — Z79811 Long term (current) use of aromatase inhibitors: Secondary | ICD-10-CM | POA: Diagnosis not present

## 2017-09-02 DIAGNOSIS — C50511 Malignant neoplasm of lower-outer quadrant of right female breast: Secondary | ICD-10-CM | POA: Diagnosis present

## 2017-09-02 NOTE — Progress Notes (Addendum)
Patient Care Team: Aldean Ast, Hershal Coria as PCP - General (Physician Assistant)  DIAGNOSIS:  Encounter Diagnosis  Name Primary?  . Malignant neoplasm of lower-outer quadrant of right breast of female, estrogen receptor positive ([place])     SUMMARY OF ONCOLOGIC HISTORY:   Malignant neoplasm of lower-outer quadrant of right breast of female, estrogen receptor positive ([place])   05/23/2017 Initial Diagnosis    Patient had a CT scan for swelling of the right leg and incidentally right breast mass was noted subsequently had a mammogram and ultrasound.  Mammogram revealed 11 mm right breast mass lower outer quadrant axilla is negative, biopsy revealed IDC grade 1 ER PR positive HER-2 negative with a Ki-67 of 1%, T1CN0 stage Ia      06/26/2017 Surgery    Right lumpectomy: IDC grade 1, 1.3 cm, less than 0.1 cm to anterior margin, perineural invasion present, 0/2 lymph nodes negative, T1c N0 stage I a, ER 90%, PR 90%, Ki-67 1%, HER-2 negative ratio 1.68      07/12/2017 Oncotype testing    Recurrence score: 17, risk of recurrence at 9 years 5%      07/30/2017 - 08/27/2017 Radiation Therapy    Ad[facility] at [facility]       CHIEF COMPLIANT: Severely stressed out and anxious, follow-up after recent radiation therapy in [place]  INTERVAL HISTORY: Carla Bryan is a 57 year old with above-mentioned history of right breast cancer treated with lumpectomy and completed radiation therapy in [place].  She complains of severe stress and anxiety.  Because this she has not been able to enjoy her life.  She is very about financial stresses of the treatment.  REVIEW OF SYSTEMS:   Constitutional: Denies fevers, chills or abnormal weight loss Eyes: Denies blurriness of vision Ears, nose, mouth, throat, and face: Denies mucositis or sore throat Respiratory: Denies cough, dyspnea or wheezes Cardiovascular: Denies palpitation, chest discomfort Gastrointestinal:  Denies nausea, heartburn or change in bowel habits Skin: Denies  abnormal skin rashes Lymphatics: Denies new lymphadenopathy or easy bruising Neurological:Denies numbness, tingling or new weaknesses Behavioral/Psych: Mood is stable, no new changes  Extremities: No lower extremity edema  All other systems were reviewed with the patient and are negative.  I have reviewed the past medical history, past surgical history, social history and family history with the patient and they are unchanged from previous note.  ALLERGIES:  is allergic to erythromycin.  MEDICATIONS:  No current outpatient medications on file.   No current facility-administered medications for this visit.     PHYSICAL EXAMINATION: ECOG PERFORMANCE STATUS: 1 - Symptomatic but completely ambulatory  Vitals:   09/02/17 1126  BP: 119/85  Pulse: 78  Resp: 18  Temp: 98.3 F (36.8 C)  SpO2: 98%   Filed Weights   09/02/17 1126  Weight: 180 lb 6.4 oz (81.8 kg)    GENERAL:alert, no distress and comfortable SKIN: skin color, texture, turgor are normal, no rashes or significant lesions EYES: normal, Conjunctiva are pink and non-injected, sclera clear OROPHARYNX:no exudate, no erythema and lips, buccal mucosa, and tongue normal  NECK: supple, thyroid normal size, non-tender, without nodularity LYMPH:  no palpable lymphadenopathy in the cervical, axillary or inguinal LUNGS: clear to auscultation and percussion with normal breathing effort HEART: regular rate & rhythm and no murmurs and no lower extremity edema ABDOMEN:abdomen soft, non-tender and normal bowel sounds MUSCULOSKELETAL:no cyanosis of digits and no clubbing  NEURO: alert & oriented x 3 with fluent speech, no focal motor/sensory deficits EXTREMITIES: No lower extremity edema  BREAST: No palpable masses or nodules in either right or left breasts. No palpable axillary supraclavicular or infraclavicular adenopathy no breast tenderness or nipple discharge. (exam performed in the presence of a chaperone)  LABORATORY DATA:  I  have reviewed the data as listed No flowsheet data found.  No results found for: WBC, HGB, HCT, MCV, PLT, NEUTROABS  ASSESSMENT & PLAN:  Malignant neoplasm of lower-outer quadrant of right breast of female, estrogen receptor positive ([place]) 06/26/2017:Right lumpectomy: IDC grade 1, 1.3 cm, less than 0.1 cm to anterior margin, perineural invasion present, 0/2 lymph nodes negative, T1c N0 stage I a, ER 90%, PR 90%, Ki-67 1%, HER-2 negative ratio 1.68 Oncotype DX recurrence score 17: Risk of recurrence 9 years: 5%  Adjuvant radiation therapy Treatment plan: Adjuvant antiestrogen therapy with letrozole 2.5 mg daily x7 years  Letrozole counseling:We discussed the risks and benefits of anti-estrogen therapy with aromatase inhibitors. These include but not limited to insomnia, hot flashes, mood changes, vaginal dryness, bone density loss, and weight gain. We strongly believe that the benefits far outweigh the risks. Patient understands these risks and consented to starting treatment. Planned treatment duration is 7 years.  Return to clinic in 3 months for survivorship care plan visit    No orders of the defined types were placed in this encounter.  The patient has a good understanding of the overall plan. she agrees with it. she will call with any problems that may develop before the next visit here.   Harriette Ohara, MD 09/02/17   Addendum: Due to severe stress regarding nonmedical issues, she would like to take a 78-monthbreak from all these procedures that she is going through.  When she comes back in 2 months or when she calls we can send a prescription for letrozole 2.5 mg daily x7 to 10 years.

## 2017-09-02 NOTE — Telephone Encounter (Signed)
Gave patient avs and calendar of upcoming January appts.

## 2017-09-02 NOTE — Assessment & Plan Note (Signed)
06/26/2017:Right lumpectomy: IDC grade 1, 1.3 cm, less than 0.1 cm to anterior margin, perineural invasion present, 0/2 lymph nodes negative, T1c N0 stage I a, ER 90%, PR 90%, Ki-67 1%, HER-2 negative ratio 1.68 Oncotype DX recurrence score 17: Risk of recurrence 9 years: 5%  Adjuvant radiation therapy Treatment plan: Adjuvant antiestrogen therapy with letrozole 2.5 mg daily x7 years  Letrozole counseling:We discussed the risks and benefits of anti-estrogen therapy with aromatase inhibitors. These include but not limited to insomnia, hot flashes, mood changes, vaginal dryness, bone density loss, and weight gain. We strongly believe that the benefits far outweigh the risks. Patient understands these risks and consented to starting treatment. Planned treatment duration is 7 years.  Return to clinic in 3 months for survivorship care plan visit 

## 2017-11-08 ENCOUNTER — Other Ambulatory Visit: Payer: Self-pay

## 2017-11-08 ENCOUNTER — Telehealth: Payer: Self-pay

## 2017-11-08 MED ORDER — LETROZOLE 2.5 MG PO TABS: 2.5000 mg | ORAL_TABLET | Freq: Every day | ORAL | 0 refills | Status: DC

## 2017-11-08 NOTE — Telephone Encounter (Signed)
Returned patient's call.  Patient requesting to start antihormonal therapy.  Rx for Letrozole sent to pharmacy per Dr. Lindi Adie.  Educated on side effects of medication.  Patient will call if any issues with medication.

## 2017-12-09 ENCOUNTER — Telehealth: Payer: Self-pay

## 2017-12-09 NOTE — Telephone Encounter (Signed)
Returned patient's call regarding side effects of medication, letrozole.    Patient have trouble with insomnia, hot flashes, weight gain, and stiffness.  Patient reports taking medication at night.    Per Dr. Geralyn Flash recommendation hold medication for 2 weeks and call office to update on symptoms.  Will consider change of medication at that time.  Patient voiced understanding and agreement.  No further needs at this time.

## 2017-12-24 ENCOUNTER — Telehealth: Payer: Self-pay

## 2017-12-24 NOTE — Telephone Encounter (Signed)
Returned patient's call.  Contact information left on voicemail for return call.

## 2017-12-25 ENCOUNTER — Other Ambulatory Visit: Payer: Self-pay

## 2017-12-25 MED ORDER — ANASTROZOLE 1 MG PO TABS: 1.0000 mg | ORAL_TABLET | Freq: Every day | ORAL | 0 refills | Status: DC

## 2017-12-25 NOTE — Progress Notes (Signed)
Pt called to report that she had been off of letrozole x2 weeks and has been feeling so much better. All her previous symptoms have resolved completely. She would like to see if she can get an alternative medication started. Per Dr.Gudena, okay to have pt start on anastrozole. A 30 day supply will be sent to her pharmacy to make sure that she will be able to tolerate the medication. Side effects are similar to letrozole, but may be better tolerated. Pt verbalized understanding and will call with any further concerns.

## 2018-01-17 ENCOUNTER — Other Ambulatory Visit: Payer: Self-pay | Admitting: Hematology and Oncology

## 2018-01-30 ENCOUNTER — Other Ambulatory Visit: Payer: Self-pay

## 2018-01-30 MED ORDER — ANASTROZOLE 1 MG PO TABS: 1.0000 mg | ORAL_TABLET | Freq: Every day | ORAL | 2 refills | Status: DC

## 2018-01-31 ENCOUNTER — Other Ambulatory Visit: Payer: Self-pay | Admitting: Hematology and Oncology

## 2018-02-27 NOTE — Progress Notes (Signed)
Patient Care Team: Aldean Ast, PA-C as PCP - General (Physician Assistant)  DIAGNOSIS:    ICD-10-CM   1. Malignant neoplasm of lower-outer quadrant of right breast of female, estrogen receptor positive ([place]) C50.511 MM DIAG BREAST TOMO BILATERAL   Z17.0     SUMMARY OF ONCOLOGIC HISTORY:   Malignant neoplasm of lower-outer quadrant of right breast of female, estrogen receptor positive ([place])   05/23/2017 Initial Diagnosis    Patient had a CT scan for swelling of the right leg and incidentally right breast mass was noted subsequently had a mammogram and ultrasound.  Mammogram revealed 11 mm right breast mass lower outer quadrant axilla is negative, biopsy revealed IDC grade 1 ER PR positive HER-2 negative with a Ki-67 of 1%, T1CN0 stage Ia    06/26/2017 Surgery    Right lumpectomy: IDC grade 1, 1.3 cm, less than 0.1 cm to anterior margin, perineural invasion present, 0/2 lymph nodes negative, T1c N0 stage I a, ER 90%, PR 90%, Ki-67 1%, HER-2 negative ratio 1.68    07/12/2017 Oncotype testing    Recurrence score: 17, risk of recurrence at 9 years 5%    07/30/2017 - 08/27/2017 Radiation Therapy    Adj XRT at [facility]     CHIEF COMPLIANT: Follow-up of anastrozole  INTERVAL HISTORY: Carla Bryan is a 58 y.o. with above-mentioned history of right breast cancer treated with lumpectomy and completed radiation therapy in [place]. She is currently on anastrozole therapy after being unable to tolerate letrozole. She presents to the clinic today with her husband and reports trouble sleeping and headaches that began with anastrozole and have since resolved. She asked about necessity of continuing arm exercises. She saw Dr. Donne Hazel in December and he performed a breast exam.   REVIEW OF SYSTEMS:   Constitutional: Denies fevers, chills or abnormal weight loss  Eyes: Denies blurriness of vision Ears, nose, mouth, throat, and face: Denies mucositis or sore throat Respiratory: Denies cough, dyspnea or  wheezes Cardiovascular: Denies palpitation, chest discomfort Gastrointestinal:  Denies nausea, heartburn or change in bowel habits Skin: Denies abnormal skin rashes Lymphatics: Denies new lymphadenopathy or easy bruising Neurological:Denies numbness, tingling or new weaknesses Behavioral/Psych: Mood is stable, no new changes (+) difficulty sleeping Extremities: No lower extremity edema Breast: denies any pain or lumps or nodules in either breasts All other systems were reviewed with the patient and are negative.  I have reviewed the past medical history, past surgical history, social history and family history with the patient and they are unchanged from previous note.  ALLERGIES:  is allergic to erythromycin.  MEDICATIONS:  Current Outpatient Medications  Medication Sig Dispense Refill  . anastrozole (ARIMIDEX) 1 MG tablet Take 1 tablet (1 mg total) by mouth daily. 90 tablet 2   No current facility-administered medications for this visit.     PHYSICAL EXAMINATION: ECOG PERFORMANCE STATUS: 1 - Symptomatic but completely ambulatory  Vitals:   03/04/18 0855  BP: 109/76  Pulse: 79  Resp: 18  Temp: 98 F (36.7 C)  SpO2: 98%   Filed Weights   03/04/18 0855  Weight: 191 lb 1.6 oz (86.7 kg)    GENERAL:alert, no distress and comfortable SKIN: skin color, texture, turgor are normal, no rashes or significant lesions EYES: normal, Conjunctiva are pink and non-injected, sclera clear OROPHARYNX:no exudate, no erythema and lips, buccal mucosa, and tongue normal  NECK: supple, thyroid normal size, non-tender, without nodularity LYMPH:  no palpable lymphadenopathy in the cervical, axillary or inguinal LUNGS: clear to auscultation  and percussion with normal breathing effort HEART: regular rate & rhythm and no murmurs and no lower extremity edema ABDOMEN:abdomen soft, non-tender and normal bowel sounds MUSCULOSKELETAL:no cyanosis of digits and no clubbing  NEURO: alert & oriented x 3  with fluent speech, no focal motor/sensory deficits EXTREMITIES: No lower extremity edema  ASSESSMENT & PLAN:  Malignant neoplasm of lower-outer quadrant of right breast of female, estrogen receptor positive ([place]) 06/26/2017:Right lumpectomy: IDC grade 1, 1.3 cm, less than 0.1 cm to anterior margin, perineural invasion present, 0/2 lymph nodes negative, T1c N0 stage I a, ER 90%, PR 90%, Ki-67 1%, HER-2 negative ratio 1.68 Oncotype DX recurrence score 17: Risk of recurrence 9 years: 5%  Adjuvant radiation therapy  Treatment plan: Adjuvant antiestrogen therapy with letrozole 2.5 mg daily switched to anastrozole December 2019 Anastrozole toxicities: Difficulty with sleeping: I instructed her to take anastrozole in the morning and see if that makes a difference. If she cannot tolerate it then we may have to switch her to exemestane. We discussed the cost of drugs and provided her with good Rx information.  Breast cancer surveillance: 1.  Breast exam 03/04/2018: Benign 2.  Mammograms will need to be scheduled for March 2020.  Return to clinic in 1 year for follow-up    Orders Placed This Encounter  Procedures  . MM DIAG BREAST TOMO BILATERAL    Standing Status:   Future    Standing Expiration Date:   03/05/2019    Order Specific Question:   Reason for Exam (SYMPTOM  OR DIAGNOSIS REQUIRED)    Answer:   Annual mammograms with history of breast cancer    Order Specific Question:   Is the patient pregnant?    Answer:   No    Order Specific Question:   Preferred imaging location?    Answer:   [facility]   The patient has a good understanding of the overall plan. she agrees with it. she will call with any problems that may develop before the next visit here.  Nicholas Lose, MD 03/04/2018   I, Cloyde Reams Dorshimer, am acting as scribe for Nicholas Lose, MD.  I have reviewed the above documentation for accuracy and completeness, and I agree with the above.

## 2018-03-04 ENCOUNTER — Inpatient Hospital Stay: Payer: Managed Care, Other (non HMO) | Attending: Hematology and Oncology | Admitting: Hematology and Oncology

## 2018-03-04 ENCOUNTER — Telehealth: Payer: Self-pay | Admitting: Hematology and Oncology

## 2018-03-04 DIAGNOSIS — G4700 Insomnia, unspecified: Secondary | ICD-10-CM | POA: Diagnosis not present

## 2018-03-04 DIAGNOSIS — Z79811 Long term (current) use of aromatase inhibitors: Secondary | ICD-10-CM | POA: Insufficient documentation

## 2018-03-04 DIAGNOSIS — Z923 Personal history of irradiation: Secondary | ICD-10-CM | POA: Diagnosis not present

## 2018-03-04 DIAGNOSIS — C50511 Malignant neoplasm of lower-outer quadrant of right female breast: Secondary | ICD-10-CM | POA: Insufficient documentation

## 2018-03-04 DIAGNOSIS — Z170 Estrogen receptor positive status [ER+]: Secondary | ICD-10-CM | POA: Insufficient documentation

## 2018-03-04 NOTE — Telephone Encounter (Signed)
Gave avs and calendar ° °

## 2018-03-04 NOTE — Assessment & Plan Note (Signed)
06/26/2017:Right lumpectomy: IDC grade 1, 1.3 cm, less than 0.1 cm to anterior margin, perineural invasion present, 0/2 lymph nodes negative, T1c N0 stage I a, ER 90%, PR 90%, Ki-67 1%, HER-2 negative ratio 1.68 Oncotype DX recurrence score 17: Risk of recurrence 9 years: 5%  Adjuvant radiation therapy  Treatment plan: Adjuvant antiestrogen therapy with letrozole 2.5 mg daily switched to anastrozole December 2019 Anastrozole toxicities:  Breast cancer surveillance: 1.  Breast exam 03/04/2018: Benign 2.  Mammograms will need to be scheduled for April 2020.  Return to clinic in 1 year for follow-up 1.

## 2018-03-26 ENCOUNTER — Telehealth: Payer: Self-pay

## 2018-03-26 NOTE — Telephone Encounter (Signed)
Called and spoke with pt regarding complaints of R hand swelling, numbness and pain with anastrozole. Pt states that she has had possible carpal tunnel syndrome, that has not been officially diagnosed, long before starting antiestrogen therapy. Pt has had on and off flare ups with pain in her arm, hands, fingers and joints. Pt states that her symptoms have gotten worse with antiestrogen medication. She also complains of difficulty sleeping, so she had switched her anastrozole to taking daily, instead of bedtime.   Pt recently went to her PCP to see if she can get proper work up for carpal tunnel syndrome, but is taking a while for her appt. Pt is in distress and would like to know what she will need to do.  Suggested that pt stop anastrozole x 2 weeks to see if her swelling and numbness goes away, as well as sleep improvement. Suggested that pt try to take ibuprofen every 4-6hrs as needed for pain management and to take OTC sleep aid medication. Pt verbalized understanding and will call in 2 weeks to report how she is doing.

## 2018-06-20 ENCOUNTER — Other Ambulatory Visit: Payer: Self-pay

## 2018-06-20 ENCOUNTER — Ambulatory Visit
Admission: RE | Admit: 2018-06-20 | Discharge: 2018-06-20 | Disposition: A | Discharge status: Home / Self Care | Payer: Managed Care, Other (non HMO) | Source: Ambulatory Visit | Attending: Hematology and Oncology | Admitting: Hematology and Oncology

## 2018-06-20 DIAGNOSIS — Z170 Estrogen receptor positive status [ER+]: Principal | ICD-10-CM

## 2018-06-20 DIAGNOSIS — C50511 Malignant neoplasm of lower-outer quadrant of right female breast: Secondary | ICD-10-CM

## 2018-06-20 HISTORY — DX: Personal history of irradiation: Z92.3

## 2019-02-24 ENCOUNTER — Telehealth: Payer: Self-pay | Admitting: Hematology and Oncology

## 2019-02-24 NOTE — Telephone Encounter (Signed)
Reschedule per MD, Called and spoke with pt, confirmed new appt 1/15

## 2019-03-05 ENCOUNTER — Ambulatory Visit: Payer: Managed Care, Other (non HMO) | Admitting: Hematology and Oncology

## 2019-03-12 IMAGING — MG BREAST SURGICAL SPECIMEN
1 series · 1 of 1 positions shown · non-contrast
Comparison: Previous exam(s).

CLINICAL DATA: Post right breast lumpectomy.

EXAM:
SPECIMEN RADIOGRAPH OF THE RIGHT BREAST

[R]
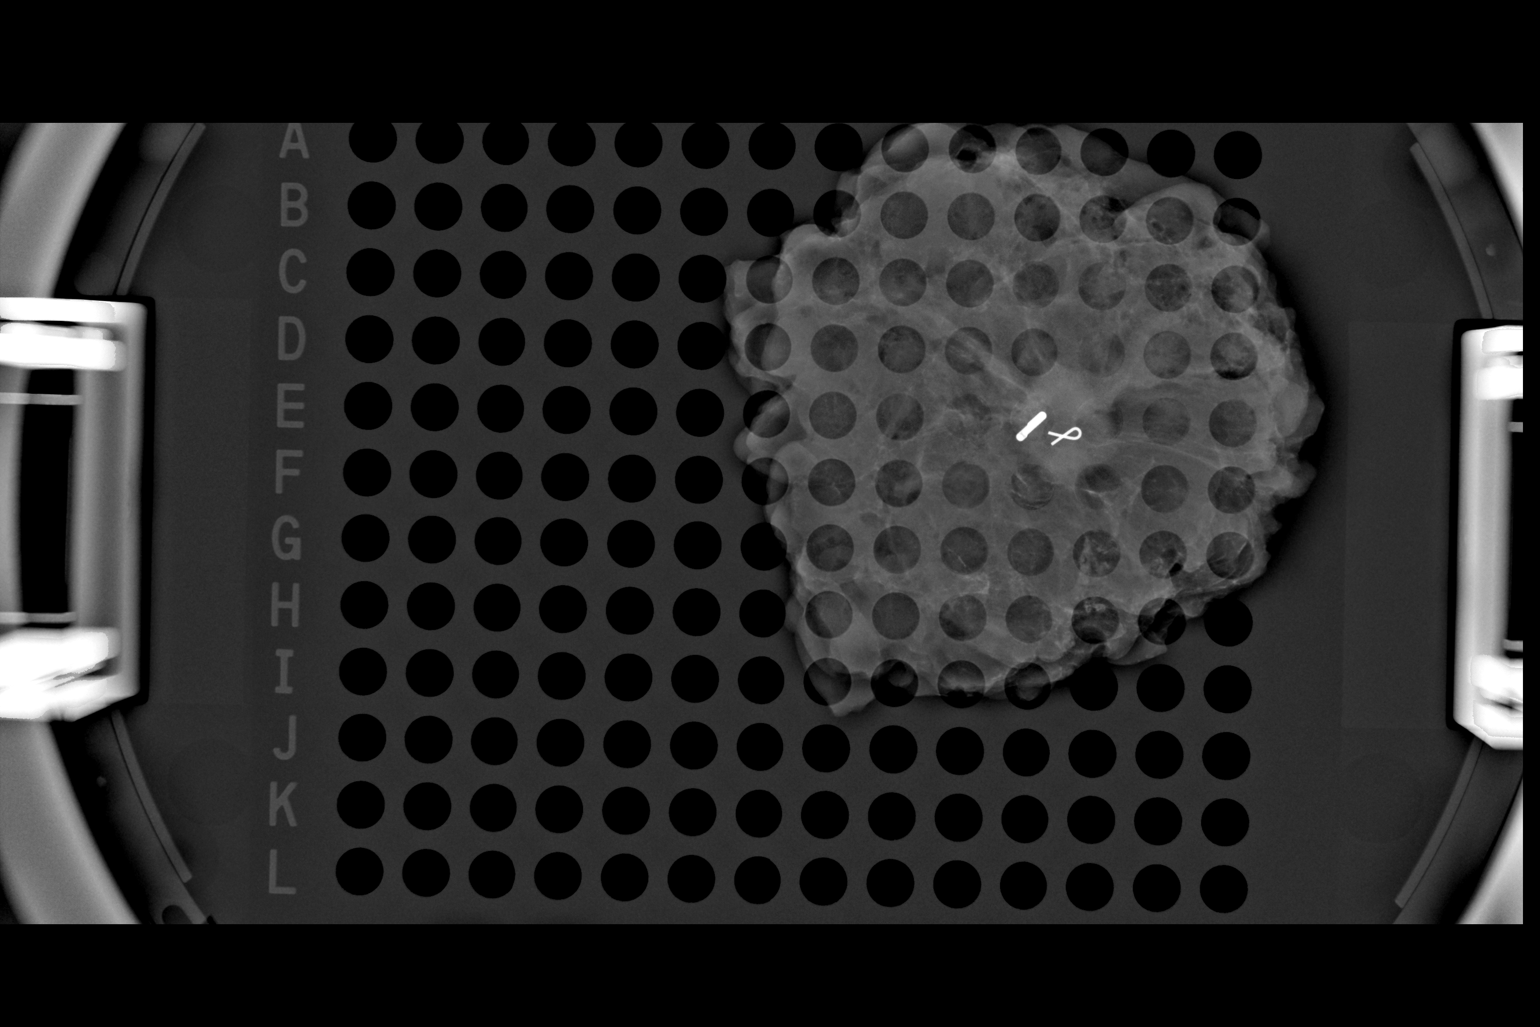

[1 of 1 positions shown; findings below may reference images not displayed]

FINDINGS: Status post excision of the right breast. The radioactive seed and
biopsy marker clip are present, completely intact, and were marked
for pathology.
IMPRESSION: Specimen radiograph of the right breast.

## 2019-03-12 NOTE — Progress Notes (Signed)
HEMATOLOGY-ONCOLOGY MYCHART VIDEO VISIT PROGRESS NOTE  I connected with Carla Bryan on 03/13/2019 at 11:15 AM EST by MyChart video conference and verified that I am speaking with the correct person using two identifiers.  I discussed the limitations, risks, security and privacy concerns of performing an evaluation and management service by MyChart and the availability of in person appointments.  I also discussed with the patient that there may be a patient responsible charge related to this service. The patient expressed understanding and agreed to proceed.  Patient's Location: Home Physician Location: Clinic  CHIEF COMPLIANT: Follow-up of right breast cancer on anastrozole  INTERVAL HISTORY: Carla Bryan is a 59 y.o. female with above-mentioned history of right breast cancer treated with lumpectomy, radiation, and who is currently on anastrozole therapy. Mammogram on 06/20/18 showed no evidence of malignancy bilaterally. She presents over MyChart today for follow-up.  She informed me today that she had stopped antiestrogen therapy in October because of problems with sleep as well as headaches and not feeling generally well.  She has felt markedly better since she stopped it.  Her family members also appreciating the fact that she is back to herself.  Oncology History  Malignant neoplasm of lower-outer quadrant of right breast of female, estrogen receptor positive ([place])  05/23/2017 Initial Diagnosis   Patient had a CT scan for swelling of the right leg and incidentally right breast mass was noted subsequently had a mammogram and ultrasound.  Mammogram revealed 11 mm right breast mass lower outer quadrant axilla is negative, biopsy revealed IDC grade 1 ER PR positive HER-2 negative with a Ki-67 of 1%, T1CN0 stage Ia   06/26/2017 Surgery   Right lumpectomy: IDC grade 1, 1.3 cm, less than 0.1 cm to anterior margin, perineural invasion present, 0/2 lymph nodes negative, T1c N0 stage I a, ER 90%,  PR 90%, Ki-67 1%, HER-2 negative ratio 1.68   07/12/2017 Oncotype testing   Recurrence score: 17, risk of recurrence at 9 years 5%   07/30/2017 - 08/27/2017 Radiation Therapy   Adj XRT at [place]:  Malignant neoplasm of lower-outer quadrant of right breast of female, estrogen receptor positive ([place]) 06/26/2017:Right lumpectomy: IDC grade 1, 1.3 cm, less than 0.1 cm to anterior margin, perineural invasion present, 0/2 lymph nodes negative, T1c N0 stage I a, ER 90%, PR 90%, Ki-67 1%, HER-2 negative ratio 1.68 Oncotype DX recurrence score 17: Risk of recurrence9years: 5% Adjuvant radiation therapy  Treatment plan: Adjuvant antiestrogen therapy with letrozole 2.5 mg daily switched to anastrozole December 2019 stopped Oct 2020  Anastrozole toxicities: 1. Headaches 2. Carpal tunnel 3. Insomnia Because of these symproms she discontinued anastrozole therapy. We discussed different options including alternative antiestrogen therapies and she was not keen on taking it.  She understands the risk of cancer recurrence can be between 5 to 10% in the next 9 years without antiestrogen therapy.  Breast cancer surveillance: 1.  Breast exam  Benign 2.  Mammograms 06/20/2018: Benign breast density category B  Return to clinic in 1 year for follow-up    I discussed the assessment and treatment plan with the patient. The patient was provided an opportunity to ask questions and all were answered. The patient agreed with the plan and demonstrated an understanding of the instructions. The patient was advised to call back or seek an in-person evaluation if the symptoms worsen or if the condition fails to improve as anticipated.   I provided 15 minutes of face-to-face MyChart video  visit time during this encounter.    Rulon Eisenmenger, MD 03/13/2019   I, Molly Dorshimer, am acting as scribe for Nicholas Lose, MD.  I have reviewed the above documentation for accuracy and completeness, and I  agree with the above.

## 2019-03-13 ENCOUNTER — Inpatient Hospital Stay: Payer: Managed Care, Other (non HMO) | Attending: Hematology and Oncology | Admitting: Hematology and Oncology

## 2019-03-13 DIAGNOSIS — Z9223 Personal history of estrogen therapy: Secondary | ICD-10-CM | POA: Diagnosis not present

## 2019-03-13 DIAGNOSIS — Z923 Personal history of irradiation: Secondary | ICD-10-CM | POA: Diagnosis not present

## 2019-03-13 DIAGNOSIS — Z170 Estrogen receptor positive status [ER+]: Secondary | ICD-10-CM | POA: Diagnosis not present

## 2019-03-13 DIAGNOSIS — C50511 Malignant neoplasm of lower-outer quadrant of right female breast: Secondary | ICD-10-CM | POA: Diagnosis present

## 2019-03-13 NOTE — Assessment & Plan Note (Signed)
06/26/2017:Right lumpectomy: IDC grade 1, 1.3 cm, less than 0.1 cm to anterior margin, perineural invasion present, 0/2 lymph nodes negative, T1c N0 stage I a, ER 90%, PR 90%, Ki-67 1%, HER-2 negative ratio 1.68 Oncotype DX recurrence score 17: Risk of recurrence9years: 5%  Adjuvant radiation therapy  Treatment plan: Adjuvant antiestrogen therapy with letrozole 2.5 mg daily switched to anastrozole December 2019  Anastrozole toxicities: Insomnia:  Breast cancer surveillance: 1.  Breast exam 03/13/2019: Benign 2.  Mammograms 06/20/2018: Benign breast density category B  Return to clinic in 1 year for follow-up

## 2019-06-23 ENCOUNTER — Other Ambulatory Visit: Payer: Self-pay

## 2019-06-23 ENCOUNTER — Ambulatory Visit
Admission: RE | Admit: 2019-06-23 | Discharge: 2019-06-23 | Disposition: A | Discharge status: Home / Self Care | Payer: Managed Care, Other (non HMO) | Source: Ambulatory Visit | Attending: Hematology and Oncology | Admitting: Hematology and Oncology

## 2019-06-23 DIAGNOSIS — C50511 Malignant neoplasm of lower-outer quadrant of right female breast: Secondary | ICD-10-CM

## 2020-05-31 ENCOUNTER — Other Ambulatory Visit: Payer: Self-pay | Admitting: Hematology and Oncology

## 2020-05-31 DIAGNOSIS — Z9889 Other specified postprocedural states: Secondary | ICD-10-CM

## 2020-06-23 ENCOUNTER — Other Ambulatory Visit: Payer: Self-pay | Admitting: Hematology and Oncology

## 2020-06-23 DIAGNOSIS — Z853 Personal history of malignant neoplasm of breast: Secondary | ICD-10-CM

## 2020-06-28 ENCOUNTER — Other Ambulatory Visit: Payer: Self-pay

## 2020-06-28 ENCOUNTER — Ambulatory Visit
Admission: RE | Admit: 2020-06-28 | Discharge: 2020-06-28 | Disposition: A | Discharge status: Home / Self Care | Payer: Managed Care, Other (non HMO) | Source: Ambulatory Visit | Attending: Hematology and Oncology | Admitting: Hematology and Oncology

## 2020-06-28 DIAGNOSIS — Z853 Personal history of malignant neoplasm of breast: Secondary | ICD-10-CM

## 2021-05-26 NOTE — Progress Notes (Signed)
? ?Patient Care Team: ?Mohammed Kindle as PCP - General (Physician Assistant) ? ?DIAGNOSIS:  ?Encounter Diagnosis  ?Name Primary?  ? Malignant neoplasm of lower-outer quadrant of right breast of female, estrogen receptor positive ([place])   ? ? ?SUMMARY OF ONCOLOGIC HISTORY: ?Oncology History  ?Malignant neoplasm of lower-outer quadrant of right breast of female, estrogen receptor positive ([place])  ?05/23/2017 Initial Diagnosis  ? Patient had a CT scan for swelling of the right leg and incidentally right breast mass was noted subsequently had a mammogram and ultrasound.  Mammogram revealed 11 mm right breast mass lower outer quadrant axilla is negative, biopsy revealed IDC grade 1 ER PR positive HER-2 negative with a Ki-67 of 1%, T1CN0 stage Ia ?  ?06/26/2017 Surgery  ? Right lumpectomy: IDC grade 1, 1.3 cm, less than 0.1 cm to anterior margin, perineural invasion present, 0/2 lymph nodes negative, T1c N0 stage I a, ER 90%, PR 90%, Ki-67 1%, HER-2 negative ratio 1.68 ?  ?07/12/2017 Oncotype testing  ? Recurrence score: 17, risk of recurrence at 9 years 5% ?  ?07/30/2017 - 08/27/2017 Radiation Therapy  ? Adj XRT at [facility] ?  ? ? ?CHIEF COMPLIANT:  Follow-up breast cancer  ? ?INTERVAL HISTORY: Carla Bryan is a 61 year old with above-mentioned history of right breast cancer treated with lumpectomy and completed radiation therapy in [place]. She presents to the clinic for a annual follow-up. She states everything has been going fine. Denies pain in breast.  Since she stopped antiestrogen therapy she has been feeling much better.  Denies any hot flashes or arthralgias or myalgias.  She has been taking care of her 37 year old parents. ? ? ?ALLERGIES:  is allergic to erythromycin. ?  ? ?PHYSICAL EXAMINATION: ?ECOG PERFORMANCE STATUS: 1 - Symptomatic but completely ambulatory ? ?Vitals:  ? 05/29/21 1104  ?BP: (!) 147/95  ?Pulse: 66  ?Resp: 18  ?Temp: 97.6 ?F (36.4 ?C)  ?SpO2: 100%  ? ?Filed Weights  ? 05/29/21 1104  ?Weight: 203 lb  11.2 oz (92.4 kg)  ? ? ?BREAST: No palpable masses or nodules in either right or left breasts. No palpable axillary supraclavicular or infraclavicular adenopathy no breast tenderness or nipple discharge. (exam performed in the presence of a chaperone) ? ?LABORATORY DATA:  ?I have reviewed the data as listed ?   ? View : No data to display.  ?  ?  ?  ? ? ?No results found for: WBC, HGB, HCT, MCV, PLT, NEUTROABS ? ?ASSESSMENT & PLAN:  ?Malignant neoplasm of lower-outer quadrant of right breast of female, estrogen receptor positive ([place]) ?06/26/2017:Right lumpectomy: IDC grade 1, 1.3 cm, less than 0.1 cm to anterior margin, perineural invasion present, 0/2 lymph nodes negative, T1c N0 stage I a, ER 90%, PR 90%, Ki-67 1%, HER-2 negative ratio 1.68 ?Oncotype DX recurrence score 17: Risk of recurrence 9 years: 5%  ?Adjuvant radiation therapy ?  ?Treatment plan: Adjuvant antiestrogen therapy with letrozole 2.5 mg daily switched to anastrozole December 2019 stopped Oct 2020 (headaches, insomnia) ?  ?Breast cancer surveillance: ?1.  Breast exam follow-up 05/15/2021 Benign ?2.  Mammograms  06/29/2018 benign breast density category B ?We will do screening mammograms with this year onwards. ?  ?She stays busy taking care of her 72 year old parents. ?She wants to follow with the primary care doctor and come to see [place] only on an as-needed basis. ? ? ? ?Orders Placed This Encounter  ?Procedures  ? MM Digital Screening  ?  Standing Status:   Future  ?  Standing Expiration Date:   05/29/2022  ?  Order Specific Question:   Reason for Exam (SYMPTOM  OR DIAGNOSIS REQUIRED)  ?  Answer:   Annual screening mammograms with H/O breast cancer  ?  Order Specific Question:   Is the patient pregnant?  ?  Answer:   No  ?  Order Specific Question:   Preferred imaging location?  ?  [facility]  ?  Order Specific Question:   Release to patient  ?  Answer:   Immediate  ? ?The patient has a good understanding of the overall plan. she agrees  with it. she will call with any problems that may develop before the next visit here. ?Total time spent: 30 mins including face to face time and time spent for planning, charting and co-ordination of care ? ? Harriette Ohara, MD ?05/29/21 ? ? ? I Gardiner Coins am scribing for Dr. Lindi Adie ? ?I have reviewed the above documentation for accuracy and completeness, and I agree with the above. ?  ?

## 2021-05-29 ENCOUNTER — Inpatient Hospital Stay: Payer: Managed Care, Other (non HMO) | Attending: Hematology and Oncology | Admitting: Hematology and Oncology

## 2021-05-29 ENCOUNTER — Other Ambulatory Visit: Payer: Self-pay

## 2021-05-29 DIAGNOSIS — Z170 Estrogen receptor positive status [ER+]: Secondary | ICD-10-CM | POA: Insufficient documentation

## 2021-05-29 DIAGNOSIS — C50511 Malignant neoplasm of lower-outer quadrant of right female breast: Secondary | ICD-10-CM | POA: Insufficient documentation

## 2021-05-29 NOTE — Assessment & Plan Note (Addendum)
06/26/2017:Right lumpectomy: IDC grade 1, 1.3 cm, less than 0.1 cm to anterior margin, perineural invasion present, 0/2 lymph nodes negative, T1c N0 stage I a, ER 90%, PR 90%, Ki-67 1%, HER-2 negative ratio 1.68 ?Oncotype DX recurrence score 17: Risk of recurrence?9?years: 5%? ?Adjuvant radiation therapy ?? ?Treatment plan: Adjuvant antiestrogen therapy with letrozole 2.5 mg daily?switched to anastrozole December 2019 stopped Oct 2020 (headaches, insomnia) ?? ?Breast cancer surveillance: ?1.??Breast exam follow-up 05/15/2021 Benign ?2.??Mammograms  06/29/2018 benign breast density category B ?We will do screening mammograms with this year onwards. ?? ?She wants to follow with the primary care doctor and come to see [place] only on an as-needed basis. ? ?

## 2021-06-29 ENCOUNTER — Ambulatory Visit
Admission: RE | Admit: 2021-06-29 | Discharge: 2021-06-29 | Disposition: A | Discharge status: Home / Self Care | Payer: Managed Care, Other (non HMO) | Source: Ambulatory Visit | Attending: Hematology and Oncology | Admitting: Hematology and Oncology

## 2021-06-29 DIAGNOSIS — C50511 Malignant neoplasm of lower-outer quadrant of right female breast: Secondary | ICD-10-CM

## 2021-06-29 HISTORY — DX: Malignant neoplasm of unspecified site of unspecified female breast: C50.919

## 2022-05-29 ENCOUNTER — Other Ambulatory Visit: Payer: Self-pay | Admitting: Adult Health

## 2022-05-29 DIAGNOSIS — Z1231 Encounter for screening mammogram for malignant neoplasm of breast: Secondary | ICD-10-CM

## 2022-07-04 ENCOUNTER — Ambulatory Visit
Admission: RE | Admit: 2022-07-04 | Discharge: 2022-07-04 | Disposition: A | Discharge status: Home / Self Care | Payer: Managed Care, Other (non HMO) | Source: Ambulatory Visit | Attending: Adult Health | Admitting: Adult Health

## 2022-07-04 DIAGNOSIS — Z1231 Encounter for screening mammogram for malignant neoplasm of breast: Secondary | ICD-10-CM

## 2022-11-02 LAB — EXTERNAL GENERIC LAB PROCEDURE: COLOGUARD: NEGATIVE

## 2022-11-02 LAB — COLOGUARD: COLOGUARD: NEGATIVE

## 2023-03-15 IMAGING — MG MM DIGITAL SCREENING BILAT W/ TOMO AND CAD
6 of 12 series · 6 of 36 positions shown · non-contrast
Comparison: Previous exam(s).

CLINICAL DATA: Screening. Personal history of malignant lumpectomy
of the LOWER OUTER QUADRANT of the RIGHT breast in 4263.

EXAM:
DIGITAL SCREENING BILATERAL MAMMOGRAM WITH TOMOSYNTHESIS AND CAD
TECHNIQUE: Bilateral screening digital craniocaudal and mediolateral oblique
mammograms were obtained. Bilateral screening digital breast
tomosynthesis was performed. The images were evaluated with
computer-aided detection.

[R AT synth-2D]
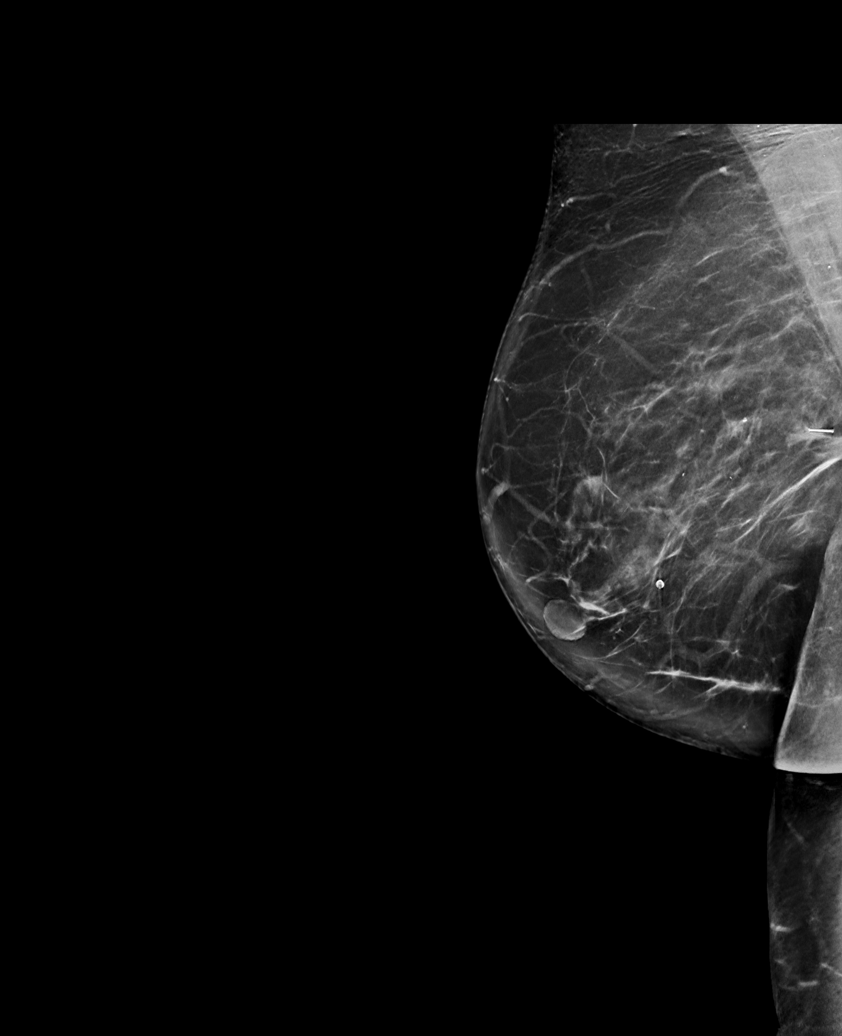

[R MLO synth-2D]
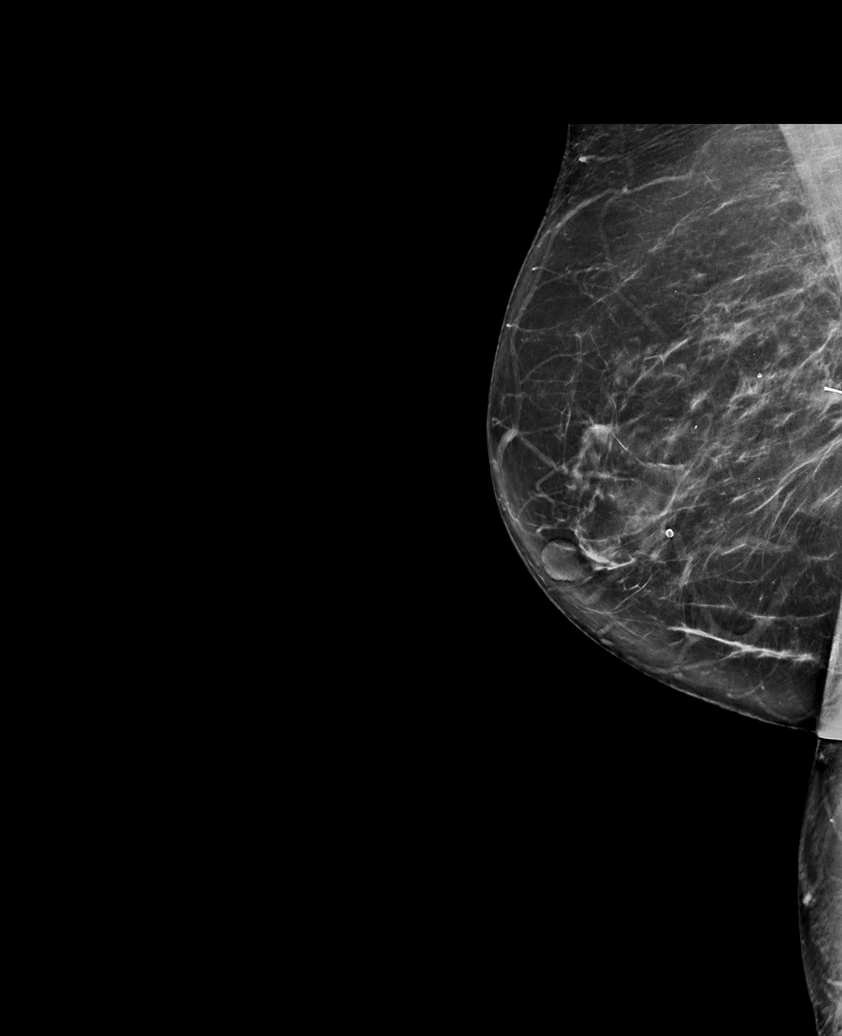

[L MLO synth-2D]
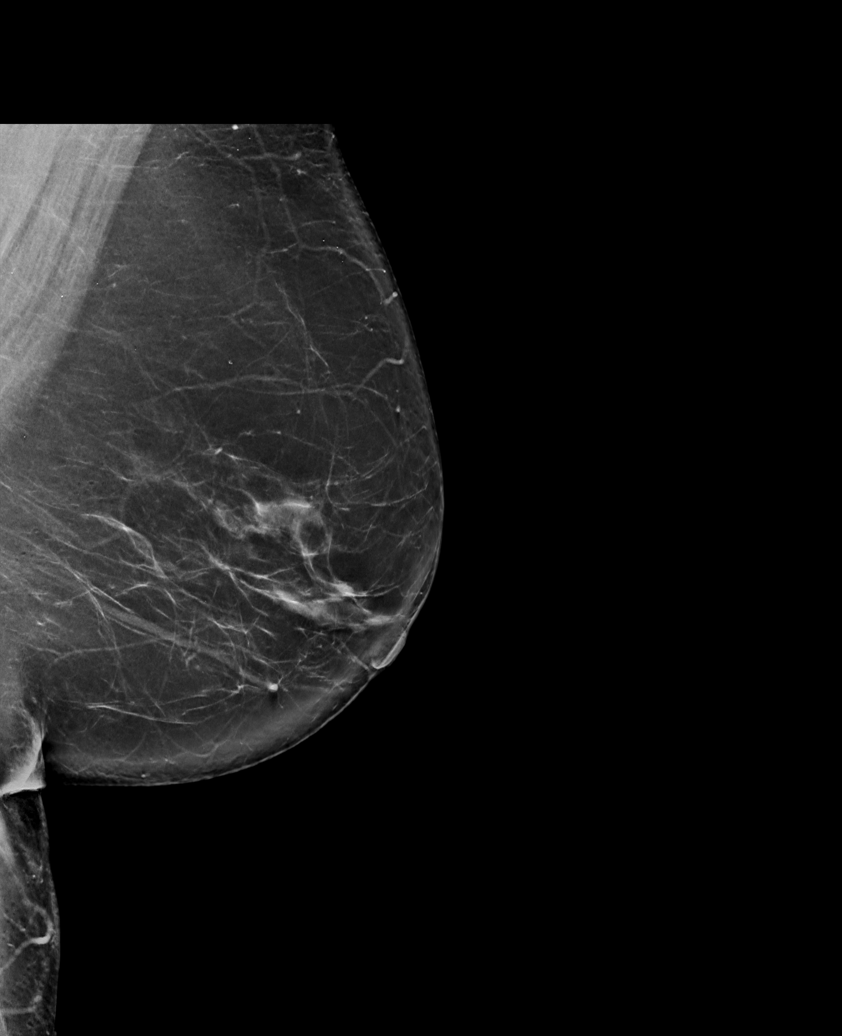

[R CC synth-2D (1 of 2)]
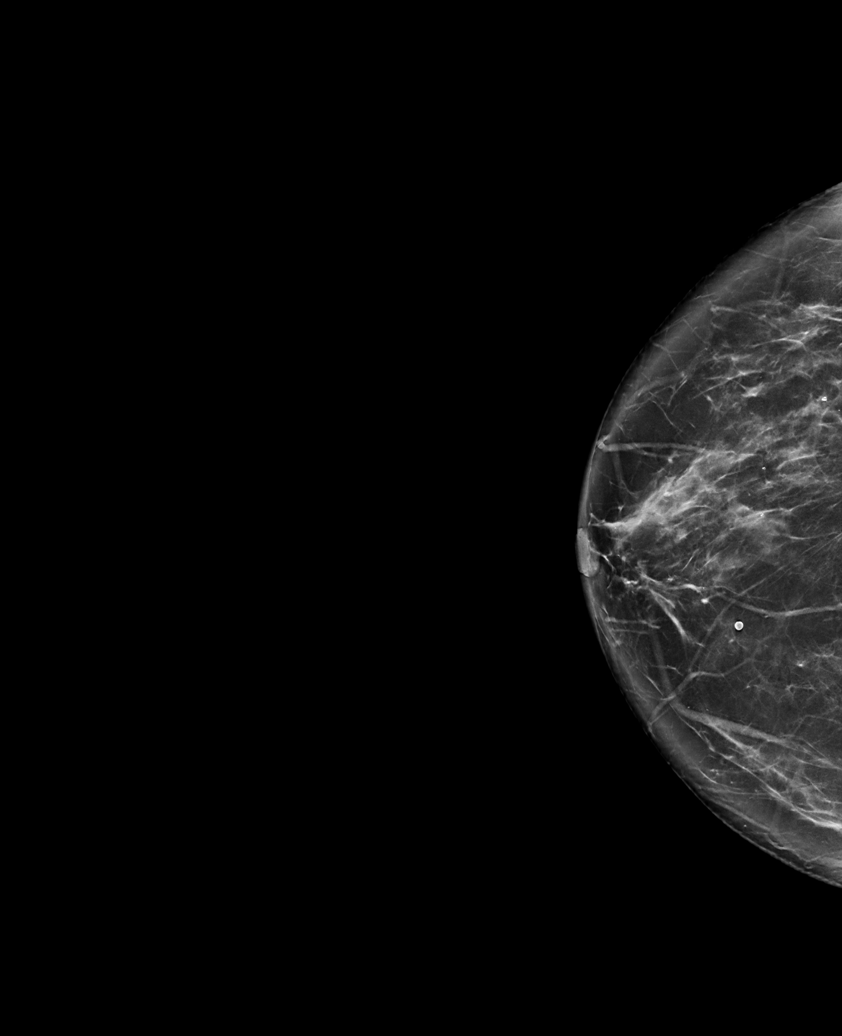

[R CC synth-2D (2 of 2)]
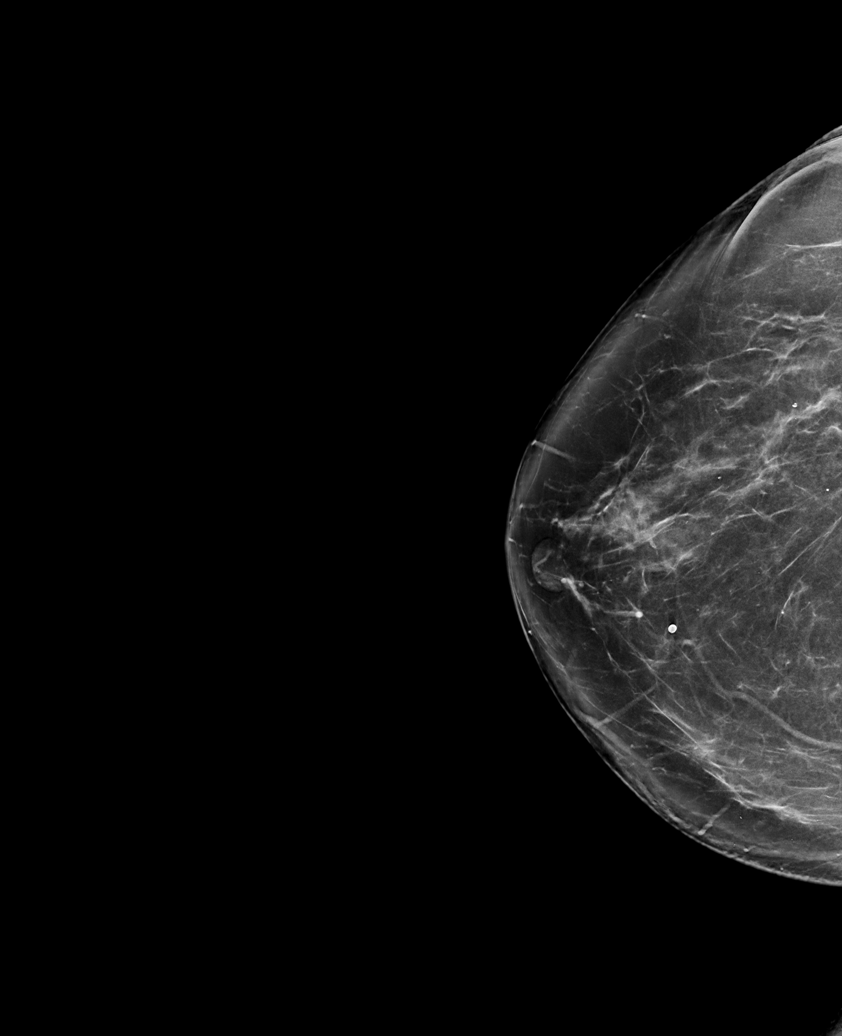

[L CC synth-2D]
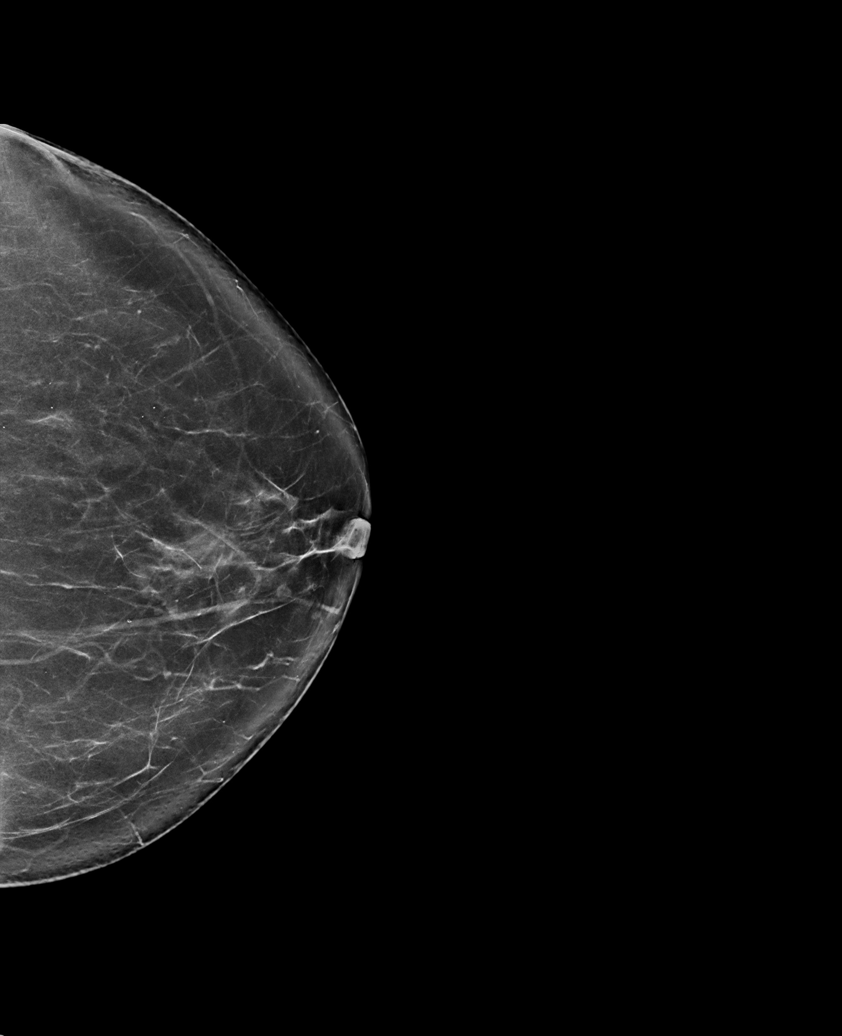

[6 of 36 positions shown; findings below may reference images not displayed]

ACR Breast Density Category b: There are scattered areas of
fibroglandular density.
FINDINGS: There are no findings suspicious for malignancy. Expected post
lumpectomy changes involving the RIGHT breast.
IMPRESSION: No mammographic evidence of malignancy. A result letter of this
screening mammogram will be mailed directly to the patient.

RECOMMENDATION:
Screening mammogram in one year. (Code:NH-D-DA3)

BI-RADS CATEGORY  2: Benign.

## 2023-06-05 ENCOUNTER — Other Ambulatory Visit: Payer: Self-pay | Admitting: Physician Assistant

## 2023-06-05 DIAGNOSIS — Z1231 Encounter for screening mammogram for malignant neoplasm of breast: Secondary | ICD-10-CM

## 2023-07-05 ENCOUNTER — Ambulatory Visit
Admission: RE | Admit: 2023-07-05 | Discharge: 2023-07-05 | Disposition: A | Discharge status: Home / Self Care | Source: Ambulatory Visit | Attending: Physician Assistant | Admitting: Physician Assistant

## 2023-07-05 DIAGNOSIS — Z1231 Encounter for screening mammogram for malignant neoplasm of breast: Secondary | ICD-10-CM

## 2023-08-30 NOTE — Discharge Summary (Signed)
 HOSPITALIST DISCHARGE SUMMARY     Carla Bryan 63 y.o. 1960-07-08  MEDICAL RECORD #: [id number]   ADMIT DATE: 08/27/2023  DISCHARGE DATE: 08/30/2023  DISCHARGE DIAGNOSES:   Principal Problem:   Acute pulmonary embolism without acute cor pulmonale, unspecified pulmonary embolism type (HCC) Active Problems:   Acute pulmonary embolism [facility])   HOSPITAL COURSE / NOTABLE PROCEDURES:  HOSPITAL SUMMARY:    CC I got sharp right-sided rib pain   HPI Carla Dorthula Bier is a 63 y.o. female, with PMH of breast cancer s/p radiation 2019 presents with complaint of acute onset sharp right sided rib pain starting on 08/27/2023.  Initially pain improved but then got much worse.  Pain increases with deep breathing, localizes to right side. She rates pain 8/10. Denies fever, headache, previous DVT/PE. At [facility] ED labs demonstrated mild leukocytosis 11.8.  Imaging with CT abdomen detailed diverticulosis and CTA chest detailed right lower lobe PE without RV strain.  Case presented to PERT team with recommendations to admit at [facility] and start heparin infusion and get ECHO  Patient received morphine and Zofran  with symptomatic improvement, pain now rated 1/10. Hospitalist paged for further management. Seen and Evaluated ED Bed # 6   [place], [place] Evaluation: 08/28/2023 1:44 AM     08/28/2023. Chest xray demonstrated bibasilar infiltrates.Levaquin administered.   08/29/2023.  Intravenous Heparin administrated for Right Acute Pulmonary  Embolism.    08/30/2023. Patient discharged in stable condition  INSTRUCTIONS: Follow up with your Primary Care Physician in 7 days.  Follow up with Cardiothoracic Surgery to evaluate 4.0 cm Ascending Aortic Aneurysm.  Follow up with your Oncologist for hypercoagulable workup  If you develop any worsening shortness of breath or weakness, fever greater than 101.47F, then return to emergency room for further evaluation.   Check your stools daily and return to  Emergency Room if you notice red or dark stools  rincipal Problem:   Acute pulmonary embolism without acute cor pulmonale, unspecified pulmonary embolism type (HCC)     Right lower lobe Pulmonary Artery Infarct.         -  IV heparin  administered for 48 hours then transitioned to Apixaban.          - incentive spirometry, flutter valve.          -Pain control with Tylenol .         -Venous duplex of bilateral lower extremities negative for DVT          -outpatient Hematology referral sent for hypercoagulable state workup.             Bibasilar Pneumonia.           - continue  Levaquin.            -added Florastor.   3. Obesity.        - weight loss and lifestyle modification.    4. History of Breast Cancer.         S/p  radiation 2019   5.  Probably Ascending Aortic Aneurysm.          - Outpatient Cardiothoracic referral sent   Disposition: Home.  Activity: As tolerated Diet/Tube feeding: Cardiac.   No future appointments.  Echo:   1. Overall left ventricular ejection fraction is estimated at 55 to 60%.  2. Normal left ventricular diastolic filling.  3. Normal right ventricular size and systolic function.  4. Mildly dilated ascending aortic aneurysm at 4.0cm.  DISCHARGE MEDICATIONS:    Medication List  START taking these medications    apixaban 5 mg Tab tablet Commonly known as: ELIQUIS take 2 tablets two times daily by mouth for 7 days, THEN 1 tablet two times daily for 30 days. Indications: Treatment for DVT/PE. Start taking on: August 30, 2023   levoFLOXacin 750 mg Tab take 1 tablet every day by mouth  for 3 days.   Saccharomyces boulardii 250 mg Cap Commonly known as: FLORASTOR take 1 capsule every 12 (twelve) hours by mouth  for 5 days.       STOP taking these medications    naproxen 500 mg Tab Commonly known as: NAPROSYN   Ondansetron  8 mg Tbdl         Where to Get Your Medications     These medications were sent to [id number] - [place], [place] - [address]  [address], [place] [address]    Phone: [phone]  apixaban 5 mg Tab tablet levoFLOXacin 750 mg Tab Saccharomyces boulardii 250 mg Cap      DISCHARGE EXAM:  GEN : Habitus:  __x__ Nourished, ____ Cachectic, ____ Thin, ____ Obese, _____Morbidly obese, _____ appears to be comfortable and in no distress.   ____ Darel stated age. Appears ____ older ____ younger than stated age.                                           CHEST/LUNG:  Breath sounds: __x__ clear bilaterally, _no___ wheezing, __no__ rhonchi, ____ rales  CV:                   Normal S1, S2.  No murmur, gallop, click, or rub   ABD:        SOFT, NTND, normoactive bowel sounds. NEURO:  Awake, alert, oriented to person, place, situation.  No focal deficits. EXTREMITIES:      No deformity, no edema. PSYCH: ____ Mood appropriate, euthymic.  ____ Hostile.  ____ Confused. ____ Anxious. _____Dysphoric, _____ tearful. Sensorium: _x___ clear ____ altered _____ comatose.   32 minutes spent on discharging patient   This note was prepared using voice recognition software; although proof-read at the time of dictation, some errors may be present. Please inbox or call Dr. Amo Mensah directly if any clarification is needed     [facility] Service [facility] [address] [place], [place] [address] [phone]

## 2023-09-11 ENCOUNTER — Inpatient Hospital Stay: Attending: Hematology and Oncology | Admitting: Hematology and Oncology

## 2023-09-11 ENCOUNTER — Inpatient Hospital Stay

## 2023-09-11 VITALS — BP 134/80 | HR 60 | Temp 98.6°F | Resp 21 | Wt 198.0 lb

## 2023-09-11 DIAGNOSIS — E669 Obesity, unspecified: Secondary | ICD-10-CM | POA: Diagnosis not present

## 2023-09-11 DIAGNOSIS — Z853 Personal history of malignant neoplasm of breast: Secondary | ICD-10-CM | POA: Insufficient documentation

## 2023-09-11 DIAGNOSIS — Z7901 Long term (current) use of anticoagulants: Secondary | ICD-10-CM | POA: Diagnosis not present

## 2023-09-11 DIAGNOSIS — I2699 Other pulmonary embolism without acute cor pulmonale: Secondary | ICD-10-CM | POA: Insufficient documentation

## 2023-09-11 DIAGNOSIS — Z170 Estrogen receptor positive status [ER+]: Secondary | ICD-10-CM | POA: Diagnosis not present

## 2023-09-11 DIAGNOSIS — C50511 Malignant neoplasm of lower-outer quadrant of right female breast: Secondary | ICD-10-CM

## 2023-09-11 DIAGNOSIS — I719 Aortic aneurysm of unspecified site, without rupture: Secondary | ICD-10-CM | POA: Insufficient documentation

## 2023-09-11 NOTE — Assessment & Plan Note (Signed)
 08/27/2023: Hospitalization for chest pain and CT PE protocol revealed right lower lobe pulmonary embolism Ultrasound lower extremities negative for DVT CT abdomen pelvis: Negative for metastatic cancer Echocardiogram: 4 cm aneurysm of aorta  I discussed with the patient risk factors for blood clots.  Inherited risk factors include: 1. Factor V Leiden mutation 2. Prothrombin gene G20210A 3. Protein S deficiency  4. Protein C deficiency  5. Antithrombin deficiency  Acquired risk factors include: 1. Antiphospholipid antibody syndrome 2.  Sedentary nests 3. Obesity 4. Medications   Patient's risk factors: Obesity  Workup recommended: Bloodwork to evaluate for the 5 inherited factors mentioned above along with antiphospholipid antibodies. Return to clinic in one to 2 weeks to discuss the results of the tests

## 2023-09-11 NOTE — Progress Notes (Signed)
 Patient Care Team: Honora Pao, DEVONNA as PCP - General (Physician Assistant)  DIAGNOSIS:  Encounter Diagnoses  Name Primary?   Malignant neoplasm of lower-outer quadrant of right breast of female, estrogen receptor positive (HCC) Yes   Acute pulmonary embolism, unspecified pulmonary embolism type, unspecified whether acute cor pulmonale present (HCC)     SUMMARY OF ONCOLOGIC HISTORY: Oncology History  Malignant neoplasm of lower-outer quadrant of right breast of female, estrogen receptor positive (HCC)  05/23/2017 Initial Diagnosis   Patient had a CT scan for swelling of the right leg and incidentally right breast mass was noted subsequently had a mammogram and ultrasound.  Mammogram revealed 11 mm right breast mass lower outer quadrant axilla is negative, biopsy revealed IDC grade 1 ER PR positive HER-2 negative with a Ki-67 of 1%, T1CN0 stage Ia   06/26/2017 Surgery   Right lumpectomy: IDC grade 1, 1.3 cm, less than 0.1 cm to anterior margin, perineural invasion present, 0/2 lymph nodes negative, T1c N0 stage I a, ER 90%, PR 90%, Ki-67 1%, HER-2 negative ratio 1.68   07/12/2017 Oncotype testing   Recurrence score: 17, risk of recurrence at 9 years 5%   07/30/2017 - 08/27/2017 Radiation Therapy   Adj XRT at [facility]     CHIEF COMPLIANT: Consultation for acute pulmonary embolism  HISTORY OF PRESENT ILLNESS:  History of Present Illness Carla Bryan is a 63 year old female with a history of breast cancer who presents with chest pain and blood clots. She was referred for evaluation of her recent blood clot and chest pain.  She experiences sharp, constant pain in the left chest, which began after rolling over in bed and slightly eased upon standing. There is no personal or family history of blood clots. She is on Eliquis for anticoagulation therapy.  Significant bruising occurs easily, even with light touches, and she attributes this to her current medication. Recent blood work  indicates a high white blood cell count, with normal hemoglobin and platelet levels. She has not engaged in prolonged immobility or recent travel, and she is not using hormone therapy or steroids.  She is under considerable stress due to the recent passing of both parents and managing her estate, which affects her sleep. She is also anticipating the birth of three grandchildren.     ALLERGIES:  is allergic to erythromycin.  MEDICATIONS:  Current Outpatient Medications  Medication Sig Dispense Refill   ELIQUIS 5 MG TABS tablet Take 5 mg by mouth.     No current facility-administered medications for this visit.    PHYSICAL EXAMINATION: ECOG PERFORMANCE STATUS: 1 - Symptomatic but completely ambulatory  Vitals:   09/11/23 1400  BP: 134/80  Pulse: 60  Resp: (!) 21  Temp: 98.6 F (37 C)  SpO2: 99%   Filed Weights   09/11/23 1400  Weight: 198 lb (89.8 kg)   ASSESSMENT & PLAN:  Acute pulmonary embolism (HCC) 08/27/2023: Hospitalization for chest pain and CT PE protocol revealed right lower lobe pulmonary embolism Ultrasound lower extremities negative for DVT CT abdomen pelvis: Negative for metastatic cancer Echocardiogram: 4 cm aneurysm of aorta  I discussed with the patient risk factors for blood clots.  Inherited risk factors include: 1. Factor V Leiden mutation 2. Prothrombin gene G20210A 3. Protein S deficiency  4. Protein C deficiency  5. Antithrombin deficiency  Acquired risk factors include: 1. Antiphospholipid antibody syndrome 2.  Sedentary nests 3. Obesity 4. Medications   Patient's risk factors: Obesity Treatment duration: Would depend on  the risk factors.  If no risk factor was clearly identified then the anticoagulation duration 6 months.  Cost of anticoagulation with Eliquis: Patient's $20-$50 every month.  We discussed that Pradaxa may be an alternate option.  Workup recommended: Bloodwork to evaluate for the 5 inherited factors mentioned above along  with antiphospholipid antibodies. Return to clinic in one to 2 weeks to discuss the results of the tests  ------------------------------------- Assessment and Plan Assessment & Plan Blood clot Experienced a blood clot with no previous history or clear family history. Differential includes inherited causes like Factor V Leiden, prothrombin gene mutation, protein C and S deficiencies, antithrombin deficiency, and acquired causes like antiphospholipid antibody syndrome. Duration of anticoagulation therapy depends on further testing. Discussed Eliquis cost and alternatives like Pradaxa. Explained bleeding as the side effect of Eliquis. - Order full panel blood work to identify clotting risk factors. - Continue Eliquis for anticoagulation. - Discuss cost-effective alternatives to Eliquis, such as Pradaxa, and explore pharmacy options using [facility]. - Educate on avoiding activities that may cause bleeding due to anticoagulation therapy.  Bruising Bruising likely exacerbated by anticoagulation therapy. - Monitor for increased bruising and educate on avoiding trauma due to anticoagulation therapy.  Breast cancer No evidence of metastatic breast cancer on recent scans. Discussed immune system's role in cancer and clotting, no direct connection to current clotting issue.  Aneurysm 4 cm aneurysm identified on echocardiogram, likely incidental with no direct connection to blood clot. - Recommend annual echocardiogram to monitor aneurysm.      Orders Placed This Encounter  Procedures   Lupus anticoagulant panel    Standing Status:   Future    Number of Occurrences:   1    Expiration Date:   09/10/2024   Prothrombin gene mutation    Standing Status:   Future    Number of Occurrences:   1    Expiration Date:   09/10/2024   Factor 5 leiden    Standing Status:   Future    Number of Occurrences:   1    Expiration Date:   09/10/2024   Protein C, total*    Standing Status:   Future    Number of  Occurrences:   1    Expiration Date:   09/10/2024   Protein S, total    Standing Status:   Future    Number of Occurrences:   1    Expiration Date:   09/10/2024   Antithrombin III  antigen    Standing Status:   Future    Number of Occurrences:   1    Expiration Date:   09/10/2024   Cardiolipin antibodies, IgG, IgM, IgA    Standing Status:   Future    Number of Occurrences:   1    Expiration Date:   09/10/2024   Beta-2 -glycoprotein i abs, IgG/M/A    Standing Status:   Future    Number of Occurrences:   1    Expiration Date:   09/10/2024   The patient has a good understanding of the overall plan. she agrees with it. she will call with any problems that may develop before the next visit here. Total time spent: 45 mins including face to face time and time spent for planning, charting and co-ordination of care   Naomi MARLA Chad, MD 09/11/23

## 2023-09-12 LAB — CARDIOLIPIN ANTIBODIES, IGG, IGM, IGA
Anticardiolipin IgA: <9 U/mL (ref 0–11)
Anticardiolipin IgG: <9 GPL U/mL (ref 0–14)
Anticardiolipin IgM: 10 [MPL'U]/mL (ref 0–12)

## 2023-09-12 LAB — LUPUS ANTICOAGULANT PANEL
DRVVT: 38 s (ref 0.0–47.0)
PTT Lupus Anticoagulant: 33.5 s (ref 0.0–43.5)

## 2023-09-12 LAB — ANTITHROMBIN III ANTIGEN: AT III AG PPP IMM-ACNC: 123 % (ref 72–124)

## 2023-09-12 LAB — PROTEIN C, TOTAL: Protein C, Total: 95 % (ref 60–150)

## 2023-09-12 LAB — BETA-2-GLYCOPROTEIN I ABS, IGG/M/A
Beta-2 Glyco I IgG: <9 GPI IgG units (ref 0–20)
Beta-2-Glycoprotein I IgA: <9 GPI IgA units (ref 0–25)
Beta-2-Glycoprotein I IgM: <9 GPI IgM units (ref 0–32)

## 2023-09-12 LAB — PROTEIN S, TOTAL: Protein S Ag, Total: 175 % — ABNORMAL HIGH (ref 60–150)

## 2023-09-13 LAB — FACTOR 5 LEIDEN

## 2023-09-16 LAB — PROTHROMBIN GENE MUTATION

## 2023-09-20 ENCOUNTER — Inpatient Hospital Stay: Admitting: Adult Health

## 2023-09-20 DIAGNOSIS — Z853 Personal history of malignant neoplasm of breast: Secondary | ICD-10-CM

## 2023-09-20 DIAGNOSIS — Z923 Personal history of irradiation: Secondary | ICD-10-CM | POA: Diagnosis not present

## 2023-09-20 DIAGNOSIS — I2699 Other pulmonary embolism without acute cor pulmonale: Secondary | ICD-10-CM

## 2023-09-20 DIAGNOSIS — Z7901 Long term (current) use of anticoagulants: Secondary | ICD-10-CM | POA: Diagnosis not present

## 2023-09-20 DIAGNOSIS — Z170 Estrogen receptor positive status [ER+]: Secondary | ICD-10-CM

## 2023-09-20 NOTE — Progress Notes (Signed)
 [facility] Cancer Follow up:    Carla Pao, PA-C [address] [place] [address]   DIAGNOSIS: Cancer Staging  Malignant neoplasm of lower-outer quadrant of right breast of female, estrogen receptor positive (HCC) Staging form: Breast, AJCC 8th Edition - Pathologic: Stage IA (pT1c, pN0, cM0, G1, ER+, PR+, HER2-, Oncotype DX score: 17) - Unsigned Multigene prognostic tests performed: Oncotype DX Recurrence score range: Greater than or equal to 11 Histologic grading system: 3 grade system  I connected with [name] Constant on 09/20/23 at 11:40 AM EDT by telephone and verified that I am speaking with the correct person using two identifiers.  I discussed the limitations, risks, security and privacy concerns of performing an evaluation and management service by telephone and the availability of in person appointments.  I also discussed with the patient that there may be a patient responsible charge related to this service. The patient expressed understanding and agreed to proceed.  Patient location: home Provider location: [facility] office    SUMMARY OF ONCOLOGIC HISTORY: Oncology History  Malignant neoplasm of lower-outer quadrant of right breast of female, estrogen receptor positive (HCC)  05/23/2017 Initial Diagnosis   Patient had a CT scan for swelling of the right leg and incidentally right breast mass was noted subsequently had a mammogram and ultrasound.  Mammogram revealed 11 mm right breast mass lower outer quadrant axilla is negative, biopsy revealed IDC grade 1 ER PR positive HER-2 negative with a Ki-67 of 1%, T1CN0 stage Ia   06/26/2017 Surgery   Right lumpectomy: IDC grade 1, 1.3 cm, less than 0.1 cm to anterior margin, perineural invasion present, 0/2 lymph nodes negative, T1c N0 stage I a, ER 90%, PR 90%, Ki-67 1%, HER-2 negative ratio 1.68   07/12/2017 Oncotype testing   Recurrence score: 17, risk of recurrence at 9 years 5%   07/30/2017 - 08/27/2017 Radiation  Therapy   Adj XRT at [facility]     CURRENT THERAPY: eliquis  INTERVAL HISTORY:  She had PE and went to [facility] ED for PE.  She is taking Eliquis 5mg  po BID and tolerates it pretty well.  She denies any side effects.  She had CT chest/abd/pelvis that was negative for metastatic disease.  She was ntoed to have an ascending aortic aneurysm.  She was seen by cardiology since she saw Dr. Gudena and began 20mg  Lipitor and 25 mg Metoprolol XL.  She denies any side effects from these medications, today is her 6th day of taking these meds.      Patient Active Problem List   Diagnosis Date Noted   Acute pulmonary embolism (HCC) 09/11/2023   Malignant neoplasm of lower-outer quadrant of right breast of female, estrogen receptor positive (HCC) 07/04/2017    is allergic to erythromycin.  MEDICAL HISTORY: Past Medical History:  Diagnosis Date   Breast cancer (HCC)    Cancer (HCC) 05/2017   right breast cancer   Leg pain 11/2016   right leg pain and swelling   Personal history of radiation therapy    PONV (postoperative nausea and vomiting)    Spinal headache     SURGICAL HISTORY: Past Surgical History:  Procedure Laterality Date   BREAST LUMPECTOMY Right 06/26/2017   BREAST LUMPECTOMY WITH RADIOACTIVE SEED AND SENTINEL LYMPH NODE BIOPSY Right 06/26/2017   Procedure: BREAST LUMPECTOMY WITH RADIOACTIVE SEED AND SENTINEL LYMPH NODE BIOPSY;  Surgeon: Ebbie Cough, MD;  Location: [facility];  Service: General;  Laterality: Right;   CESAREAN SECTION  67 and 1995   WISDOM TOOTH EXTRACTION     as teenager    SOCIAL HISTORY: Social History   Socioeconomic History   Marital status: Married    Spouse name: Not on file   Number of children: Not on file   Years of education: Not on file   Highest education level: Not on file  Occupational History   Not on file  Tobacco Use   Smoking status: Never   Smokeless tobacco: Never  Vaping Use   Vaping status: Never Used   Substance and Sexual Activity   Alcohol use: Not Currently   Drug use: Never   Sexual activity: Not on file  Other Topics Concern   Not on file  Social History Narrative   Not on file   Social Drivers of Health   Financial Resource Strain: Low Risk  (08/28/2023)   Received from [facility]   Overall Financial Resource Strain (CARDIA)    Difficulty of Paying Living Expenses: Not hard at all  Food Insecurity: No Food Insecurity (08/28/2023)   Received from [facility]   Hunger Vital Sign    Within the past 12 months, you worried that your food would run out before you got the money to buy more.: Never true    Within the past 12 months, the food you bought just didn't last and you didn't have money to get more.: Never true  Transportation Needs: No Transportation Needs (08/28/2023)   Received from [facility] - Transportation    Lack of Transportation (Medical): No    Lack of Transportation (Non-Medical): No  Physical Activity: Insufficiently Active (08/28/2023)   Received from [facility]   Exercise Vital Sign    On average, how many days per week do you engage in moderate to strenuous exercise (like a brisk walk)?: 7 days    On average, how many minutes do you engage in exercise at this level?: 20 min  Stress: Stress Concern Present (08/28/2023)   Received from [facility] - Occupational Stress Questionnaire    Feeling of Stress : To some extent  Social Connections: Moderately Isolated (08/28/2023)   Received from [facility]   Social Connection and Isolation Panel    In a typical week, how many times do you talk on the phone with family, friends, or neighbors?: More than three times a week    How often do you get together with friends or relatives?: More than three times a week    How often do you attend church or religious services?: Never    Do you belong to any clubs or organizations such as church groups,  unions, fraternal or athletic groups, or school groups?: No    How often do you attend meetings of the clubs or organizations you belong to?: Never    Are you married, widowed, divorced, separated, never married, or living with a partner?: Married  Intimate Partner Violence: Not At Risk (08/28/2023)   Received from [facility]   Humiliation, Afraid, Rape, and Kick questionnaire    Within the last year, have you been afraid of your partner or ex-partner?: No    Within the last year, have you been humiliated or emotionally abused in other ways by your partner or ex-partner?: No    Within the last year, have you been kicked, hit, slapped, or otherwise physically hurt by your partner or ex-partner?: No    Within the last year, have  you been raped or forced to have any kind of sexual activity by your partner or ex-partner?: No    FAMILY HISTORY: Family History  Problem Relation Age of Onset   Breast cancer Neg Hx    BRCA 1/2 Neg Hx     Review of Systems  Constitutional:  Negative for appetite change, chills, fatigue, fever and unexpected weight change.  HENT:   Negative for hearing loss, lump/mass and trouble swallowing.   Eyes:  Negative for eye problems and icterus.  Respiratory:  Negative for chest tightness, cough and shortness of breath.   Cardiovascular:  Negative for chest pain, leg swelling and palpitations.  Gastrointestinal:  Negative for abdominal distention, abdominal pain, constipation, diarrhea, nausea and vomiting.  Endocrine: Negative for hot flashes.  Genitourinary:  Negative for difficulty urinating.   Musculoskeletal:  Negative for arthralgias.  Skin:  Negative for itching and rash.  Neurological:  Negative for dizziness, extremity weakness, headaches and numbness.  Hematological:  Negative for adenopathy. Does not bruise/bleed easily.  Psychiatric/Behavioral:  Negative for depression. The patient is not nervous/anxious.       PHYSICAL EXAMINATION   Patient  sounds well.  In no apparent distress.  Mood and behavior are normal.  Breathing non labored.    ASSESSMENT and THERAPY PLAN:   Patient has new onset PE on treatment with Eliquis. She will continue this daily.  Her DVT was likely secondary to infection.  I also recommended that she lose weight.  We reviewed her lab testing which was negative for acquired genetic abnormality.    RTC in 3-6 months for /fu with Dr. Gudena  The patient was provided an opportunity to ask questions and all were answered. The patient agreed with the plan and demonstrated an understanding of the instructions.   The patient was advised to call back or seek an in-person evaluation if the symptoms worsen or if the condition fails to improve as anticipated.   I provided 10 minutes of non face-to-face telephone visit time during this encounter, and > 50% was spent counseling as documented under my assessment & plan.   Morna Kendall, NP 09/20/23 11:59 AM Medical Oncology and Hematology [facility] [address] [place], [place] [address] Tel. [phone]    Fax. [phone]  *Total Encounter Time as defined by the Centers for Medicare and Medicaid Services includes, in addition to the face-to-face time of a patient visit (documented in the note above) non-face-to-face time: obtaining and reviewing outside history, ordering and reviewing medications, tests or procedures, care coordination (communications with other health care professionals or caregivers) and documentation in the medical record.

## 2024-03-19 ENCOUNTER — Inpatient Hospital Stay: Attending: Hematology and Oncology | Admitting: Hematology and Oncology

## 2024-03-19 VITALS — BP 118/81 | HR 79 | Temp 98.1°F | Resp 18 | Ht 59.0 in | Wt 206.3 lb

## 2024-03-19 DIAGNOSIS — Z170 Estrogen receptor positive status [ER+]: Secondary | ICD-10-CM

## 2024-03-19 DIAGNOSIS — I2699 Other pulmonary embolism without acute cor pulmonale: Secondary | ICD-10-CM | POA: Diagnosis not present

## 2024-03-19 DIAGNOSIS — C50511 Malignant neoplasm of lower-outer quadrant of right female breast: Secondary | ICD-10-CM | POA: Diagnosis not present

## 2024-03-19 MED ORDER — ELIQUIS 5 MG PO TABS: 5.0000 mg | ORAL_TABLET | Freq: Two times a day (BID) | ORAL | 0 refills | Status: AC

## 2024-03-19 NOTE — Assessment & Plan Note (Signed)
 06/26/2017: Right lumpectomy: IDC grade 1, 1.3 cm, less than 0.1 cm to anterior margin, perineural invasion present, 0/2 lymph nodes negative, T1c N0 stage I a, ER 90%, PR 90%, Ki-67 1%, HER-2 negative ratio 1.68  Oncotype score 17 (5% risk of recurrence in 9 years) 08/27/2017: Completed adjuvant radiation 19

## 2024-03-19 NOTE — Progress Notes (Signed)
 "  Patient Care Team: Honora Pao, DEVONNA as PCP - General (Physician Assistant) Kriste Suzen LABOR, NP-C Odean Potts, MD as Consulting Physician (Hematology and Oncology)  DIAGNOSIS:  Encounter Diagnoses  Name Primary?   Malignant neoplasm of lower-outer quadrant of right breast of female, estrogen receptor positive (HCC) Yes   Acute pulmonary embolism, unspecified pulmonary embolism type, unspecified whether acute cor pulmonale present (HCC)     SUMMARY OF ONCOLOGIC HISTORY: Oncology History  Malignant neoplasm of lower-outer quadrant of right breast of female, estrogen receptor positive (HCC)  05/23/2017 Initial Diagnosis   Patient had a CT scan for swelling of the right leg and incidentally right breast mass was noted subsequently had a mammogram and ultrasound.  Mammogram revealed 11 mm right breast mass lower outer quadrant axilla is negative, biopsy revealed IDC grade 1 ER PR positive HER-2 negative with a Ki-67 of 1%, T1CN0 stage Ia   06/26/2017 Surgery   Right lumpectomy: IDC grade 1, 1.3 cm, less than 0.1 cm to anterior margin, perineural invasion present, 0/2 lymph nodes negative, T1c N0 stage I a, ER 90%, PR 90%, Ki-67 1%, HER-2 negative ratio 1.68   07/12/2017 Oncotype testing   Recurrence score: 17, risk of recurrence at 9 years 5%   07/30/2017 - 08/27/2017 Radiation Therapy   Ad[facility] XRT at [facility]     CHIEF COMPLIANT: Follow-up of history of breast cancer and pulmonary embolism  HISTORY OF PRESENT ILLNESS:   History of Present Illness Carla Bryan is a 64 year old female with 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  She is seven years from initial diagnosis of right breast invasive ductal carcinoma (T1cN0, Stage IA) treated with lumpectomy and adjuvant radiation. She remains in remission and follows with annual mammograms  and primary care.  Six months ago she had an acute pulmonary embolism and has been on apixaban  5 mg twice daily since. She has no bruising, bleeding, swelling, or pain on anticoagulation. She finds twice-daily dosing at set times challenging but has been adherent. She has a two-month supply, partly from samples, and is concerned about high cost and has delayed refills due to expense.  Post-PE laboratory workup showed no genetic or acquired thrombophilia. She has no prior blood clots.      ALLERGIES:  is allergic to erythromycin.  MEDICATIONS:  Current Outpatient Medications  Medication Sig Dispense Refill   ELIQUIS  5 MG TABS tablet Take 5 mg by mouth.     metoprolol tartrate (LOPRESSOR) 25 MG tablet 25 mg.     atorvastatin (LIPITOR) 20 MG tablet Take 20 mg by mouth daily.     No current facility-administered medications for this visit.    PHYSICAL EXAMINATION: ECOG PERFORMANCE STATUS: 1 - Symptomatic but completely ambulatory  Vitals:   03/19/24 1408  BP: 118/81  Pulse: 79  Resp: 18  Temp: 98.1 F (36.7 C)  SpO2: 98%   Filed Weights   03/19/24 1408  Weight: 206 lb 4.8 oz (93.6 kg)      ASSESSMENT & PLAN:  Malignant neoplasm of lower-outer quadrant of right breast of female, estrogen receptor positive (HCC) 06/26/2017: Right lumpectomy: IDC grade 1, 1.3 cm, less than 0.1 cm to anterior margin, perineural invasion present, 0/2 lymph nodes negative, T1c N0 stage I a, ER 90%, PR 90%, Ki-67 1%, HER-2 negative ratio 1.68  Oncotype score 17 (5% risk of recurrence in 9 years) 08/27/2017: Completed adjuvant radiation 19  Acute pulmonary embolism (HCC) 08/27/2023: Hospitalization for chest pain and CT PE protocol revealed right lower lobe pulmonary embolism Ultrasound lower extremities negative for DVT CT abdomen pelvis: Negative for metastatic cancer Echocardiogram: 4 cm aneurysm of aorta  Hypercoagulability panel: Negative Patient's risk factor: Obesity  Current treatment:  Eliquis  Recommended continuing Eliquis  for 1 year which will be completed June 2026 I sent a new prescription. ------------------------------------- Assessment and Plan Assessment & Plan Acute pulmonary embolism Six months post-first episode, she remains on apixaban  without recurrent thrombotic symptoms or bleeding complications. No thrombophilia identified. Tolerates anticoagulation well. One year of anticoagulation recommended due to absence of ongoing risk factors and good medication tolerance. Lifelong anticoagulation not indicated. - Continued apixaban  5 mg orally twice daily until June 2026 to complete one year of anticoagulation. - Prescribed apixaban  through [facility] pharmacy for affordability. - Provided guidance on bleeding risk and safety precautions while on anticoagulation. - Advised use of graduated compression stockings during travel or prolonged immobility post-anticoagulation. - Recommended daily brisk walking for 10-15 minutes to promote venous circulation. - Advised initiation of daily low-dose aspirin after completion of anticoagulation for additional thromboprophylaxis. - Transitioned follow-up to as-needed basis post-anticoagulation, with instructions to contact for concerns.      No orders of the defined types were placed in this encounter.  The patient has a good understanding of the overall plan. she agrees with it. she will call with any problems that may develop before the next visit here.  I personally spent a total of 30 minutes in the care of the patient today including preparing to see the patient, getting/reviewing separately obtained history, performing a medically appropriate exam/evaluation, counseling and educating, placing orders, referring and communicating with other health care professionals, documenting clinical information in the EHR, independently interpreting results, communicating results, and coordinating care.   Viinay K  Oliver Neuwirth, MD 03/19/24    "

## 2024-03-19 NOTE — Assessment & Plan Note (Signed)
 08/27/2023: Hospitalization for chest pain and CT PE protocol revealed right lower lobe pulmonary embolism Ultrasound lower extremities negative for DVT CT abdomen pelvis: Negative for metastatic cancer Echocardiogram: 4 cm aneurysm of aorta  Hypercoagulability panel: Negative Patient's risk factor: Obesity  Current treatment: Eliquis 

## 2025-03-22 ENCOUNTER — Inpatient Hospital Stay: Admitting: Hematology and Oncology
